# Patient Record
Sex: Female | Born: 1985 | Race: White | Hispanic: No | Marital: Married
Health system: Southern US, Community
[De-identification: ages and names within clinical notes are randomized; demographics above are authoritative.]

## PROBLEM LIST (undated history)

## (undated) DIAGNOSIS — C8190 Hodgkin lymphoma, unspecified, unspecified site: Secondary | ICD-10-CM

## (undated) DIAGNOSIS — N9489 Other specified conditions associated with female genital organs and menstrual cycle: Secondary | ICD-10-CM

## (undated) DIAGNOSIS — F329 Major depressive disorder, single episode, unspecified: Secondary | ICD-10-CM

## (undated) DIAGNOSIS — F419 Anxiety disorder, unspecified: Secondary | ICD-10-CM

## (undated) DIAGNOSIS — N840 Polyp of corpus uteri: Secondary | ICD-10-CM

## (undated) DIAGNOSIS — F32A Depression, unspecified: Secondary | ICD-10-CM

## (undated) DIAGNOSIS — E039 Hypothyroidism, unspecified: Secondary | ICD-10-CM

## (undated) HISTORY — DX: Hodgkin lymphoma, unspecified, unspecified site: C81.90

## (undated) HISTORY — DX: Anxiety disorder, unspecified: F41.9

## (undated) HISTORY — DX: Depression, unspecified: F32.A

## (undated) HISTORY — DX: Other specified conditions associated with female genital organs and menstrual cycle: N94.89

## (undated) HISTORY — DX: Hypothyroidism, unspecified: E03.9

## (undated) HISTORY — DX: Polyp of corpus uteri: N84.0

---
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
## 2006-08-07 ENCOUNTER — Emergency Department (HOSPITAL_COMMUNITY): Admission: EM | Admit: 2006-08-07 | Discharge: 2006-08-07 | Payer: Self-pay | Admitting: Emergency Medicine

## 2008-05-14 ENCOUNTER — Emergency Department (HOSPITAL_COMMUNITY): Admission: AC | Admit: 2008-05-14 | Discharge: 2008-05-14 | Payer: Self-pay

## 2008-05-19 ENCOUNTER — Ambulatory Visit: Payer: Self-pay | Admitting: Thoracic Surgery

## 2008-05-21 ENCOUNTER — Ambulatory Visit (HOSPITAL_COMMUNITY): Admission: RE | Admit: 2008-05-21 | Discharge: 2008-05-21 | Payer: Self-pay | Admitting: Thoracic Surgery

## 2008-05-27 ENCOUNTER — Ambulatory Visit (HOSPITAL_COMMUNITY): Admission: RE | Admit: 2008-05-27 | Discharge: 2008-05-27 | Payer: Self-pay | Admitting: Thoracic Surgery

## 2008-05-27 ENCOUNTER — Ambulatory Visit: Payer: Self-pay | Admitting: Thoracic Surgery

## 2008-05-27 ENCOUNTER — Encounter: Payer: Self-pay | Admitting: Thoracic Surgery

## 2008-06-01 ENCOUNTER — Encounter: Admission: RE | Admit: 2008-06-01 | Discharge: 2008-06-01 | Payer: Self-pay

## 2008-06-01 ENCOUNTER — Ambulatory Visit: Payer: Self-pay | Admitting: Thoracic Surgery

## 2008-06-14 ENCOUNTER — Encounter: Payer: Self-pay | Admitting: Thoracic Surgery

## 2008-06-14 ENCOUNTER — Ambulatory Visit (HOSPITAL_COMMUNITY): Admission: RE | Admit: 2008-06-14 | Discharge: 2008-06-14 | Payer: Self-pay | Admitting: Thoracic Surgery

## 2008-06-16 ENCOUNTER — Ambulatory Visit: Payer: Self-pay | Admitting: Internal Medicine

## 2008-06-16 ENCOUNTER — Ambulatory Visit: Payer: Self-pay | Admitting: Thoracic Surgery

## 2008-06-22 LAB — CBC WITH DIFFERENTIAL/PLATELET
Basophils Absolute: 0 10*3/uL (ref 0.0–0.1)
EOS%: 1.8 % (ref 0.0–7.0)
HCT: 35.1 % (ref 34.8–46.6)
HGB: 12.1 g/dL (ref 11.6–15.9)
LYMPH%: 21.8 % (ref 14.0–48.0)
MCH: 28.7 pg (ref 26.0–34.0)
MCHC: 34.6 g/dL (ref 32.0–36.0)
MCV: 82.9 fL (ref 81.0–101.0)
MONO%: 5.7 % (ref 0.0–13.0)
NEUT%: 70.4 % (ref 39.6–76.8)
Platelets: 249 10*3/uL (ref 145–400)
lymph#: 1.4 10*3/uL (ref 0.9–3.3)

## 2008-06-22 LAB — COMPREHENSIVE METABOLIC PANEL
AST: 10 U/L (ref 0–37)
BUN: 11 mg/dL (ref 6–23)
Calcium: 9 mg/dL (ref 8.4–10.5)
Chloride: 104 mEq/L (ref 96–112)
Creatinine, Ser: 0.55 mg/dL (ref 0.40–1.20)
Total Bilirubin: 0.5 mg/dL (ref 0.3–1.2)

## 2008-06-28 ENCOUNTER — Ambulatory Visit: Admission: RE | Admit: 2008-06-28 | Discharge: 2008-06-28 | Payer: Self-pay | Admitting: Internal Medicine

## 2008-06-30 ENCOUNTER — Ambulatory Visit: Payer: Self-pay | Admitting: Thoracic Surgery

## 2008-06-30 ENCOUNTER — Ambulatory Visit: Admission: RE | Admit: 2008-06-30 | Discharge: 2008-06-30 | Payer: Self-pay | Admitting: Internal Medicine

## 2008-06-30 ENCOUNTER — Encounter: Payer: Self-pay | Admitting: Internal Medicine

## 2008-06-30 ENCOUNTER — Ambulatory Visit: Payer: Self-pay | Admitting: Cardiovascular Disease

## 2008-07-02 ENCOUNTER — Ambulatory Visit: Payer: Self-pay | Admitting: Thoracic Surgery

## 2008-07-02 ENCOUNTER — Ambulatory Visit (HOSPITAL_COMMUNITY): Admission: RE | Admit: 2008-07-02 | Discharge: 2008-07-02 | Payer: Self-pay | Admitting: Thoracic Surgery

## 2008-07-05 ENCOUNTER — Encounter: Payer: Self-pay | Admitting: Internal Medicine

## 2008-07-05 ENCOUNTER — Ambulatory Visit: Payer: Self-pay | Admitting: Internal Medicine

## 2008-07-05 ENCOUNTER — Ambulatory Visit (HOSPITAL_COMMUNITY): Admission: RE | Admit: 2008-07-05 | Discharge: 2008-07-05 | Payer: Self-pay | Admitting: Internal Medicine

## 2008-07-13 LAB — CBC WITH DIFFERENTIAL/PLATELET
BASO%: 0.5 % (ref 0.0–2.0)
LYMPH%: 50.9 % — ABNORMAL HIGH (ref 14.0–48.0)
MCHC: 35 g/dL (ref 32.0–36.0)
MONO#: 0 10*3/uL — ABNORMAL LOW (ref 0.1–0.9)
MONO%: 1.4 % (ref 0.0–13.0)
NEUT#: 1.3 10*3/uL — ABNORMAL LOW (ref 1.5–6.5)
Platelets: 218 10*3/uL (ref 145–400)
RBC: 4.15 10*6/uL (ref 3.70–5.32)
RDW: 13 % (ref 11.3–14.5)
WBC: 3 10*3/uL — ABNORMAL LOW (ref 3.9–10.0)

## 2008-07-13 LAB — COMPREHENSIVE METABOLIC PANEL
ALT: 76 U/L — ABNORMAL HIGH (ref 0–35)
Albumin: 4.4 g/dL (ref 3.5–5.2)
Alkaline Phosphatase: 62 U/L (ref 39–117)
CO2: 25 mEq/L (ref 19–32)
Potassium: 4.3 mEq/L (ref 3.5–5.3)
Sodium: 142 mEq/L (ref 135–145)
Total Bilirubin: 0.5 mg/dL (ref 0.3–1.2)
Total Protein: 7.8 g/dL (ref 6.0–8.3)

## 2008-07-20 LAB — CBC WITH DIFFERENTIAL/PLATELET
BASO%: 0.8 % (ref 0.0–2.0)
Basophils Absolute: 0 10*3/uL (ref 0.0–0.1)
EOS%: 2.6 % (ref 0.0–7.0)
HCT: 34.4 % — ABNORMAL LOW (ref 34.8–46.6)
HGB: 12 g/dL (ref 11.6–15.9)
MCH: 28.5 pg (ref 26.0–34.0)
MCHC: 34.8 g/dL (ref 32.0–36.0)
MCV: 82 fL (ref 81.0–101.0)
MONO%: 20.9 % — ABNORMAL HIGH (ref 0.0–13.0)
NEUT%: 21 % — ABNORMAL LOW (ref 39.6–76.8)
lymph#: 0.9 10*3/uL (ref 0.9–3.3)

## 2008-07-26 LAB — LACTATE DEHYDROGENASE: LDH: 293 U/L — ABNORMAL HIGH (ref 94–250)

## 2008-07-26 LAB — CBC WITH DIFFERENTIAL/PLATELET
BASO%: 0.2 % (ref 0.0–2.0)
EOS%: 0.2 % (ref 0.0–7.0)
HCT: 33.2 % — ABNORMAL LOW (ref 34.8–46.6)
LYMPH%: 19 % (ref 14.0–48.0)
MCH: 28.1 pg (ref 26.0–34.0)
MCHC: 34 g/dL (ref 32.0–36.0)
MONO%: 1.2 % (ref 0.0–13.0)
NEUT%: 79.4 % — ABNORMAL HIGH (ref 39.6–76.8)
Platelets: 217 10*3/uL (ref 145–400)

## 2008-07-26 LAB — COMPREHENSIVE METABOLIC PANEL
ALT: 68 U/L — ABNORMAL HIGH (ref 0–35)
AST: 48 U/L — ABNORMAL HIGH (ref 0–37)
Alkaline Phosphatase: 98 U/L (ref 39–117)
Creatinine, Ser: 0.51 mg/dL (ref 0.40–1.20)
Total Bilirubin: 0.3 mg/dL (ref 0.3–1.2)

## 2008-07-29 LAB — WOUND CULTURE

## 2008-08-01 LAB — CULTURE, BLOOD (SINGLE)

## 2008-08-05 ENCOUNTER — Ambulatory Visit: Payer: Self-pay | Admitting: Internal Medicine

## 2008-08-09 LAB — CBC WITH DIFFERENTIAL/PLATELET
BASO%: 0.7 % (ref 0.0–2.0)
Basophils Absolute: 0.1 10*3/uL (ref 0.0–0.1)
EOS%: 0.9 % (ref 0.0–7.0)
HGB: 11.2 g/dL — ABNORMAL LOW (ref 11.6–15.9)
MCH: 27.2 pg (ref 25.1–34.0)
MONO%: 6.1 % (ref 0.0–14.0)
RBC: 4.12 10*6/uL (ref 3.70–5.45)
RDW: 14.4 % (ref 11.2–14.5)
lymph#: 2.4 10*3/uL (ref 0.9–3.3)
nRBC: 1 % — ABNORMAL HIGH (ref 0–0)

## 2008-08-09 LAB — COMPREHENSIVE METABOLIC PANEL
ALT: 38 U/L — ABNORMAL HIGH (ref 0–35)
AST: 24 U/L (ref 0–37)
Albumin: 4.1 g/dL (ref 3.5–5.2)
Alkaline Phosphatase: 71 U/L (ref 39–117)
Potassium: 3.8 mEq/L (ref 3.5–5.3)
Sodium: 136 mEq/L (ref 135–145)
Total Bilirubin: 0.2 mg/dL — ABNORMAL LOW (ref 0.3–1.2)
Total Protein: 6.5 g/dL (ref 6.0–8.3)

## 2008-08-23 LAB — COMPREHENSIVE METABOLIC PANEL
ALT: 35 U/L (ref 0–35)
Alkaline Phosphatase: 60 U/L (ref 39–117)
CO2: 25 mEq/L (ref 19–32)
Creatinine, Ser: 0.55 mg/dL (ref 0.40–1.20)
Glucose, Bld: 84 mg/dL (ref 70–99)
Sodium: 141 mEq/L (ref 135–145)
Total Bilirubin: 0.3 mg/dL (ref 0.3–1.2)
Total Protein: 6.4 g/dL (ref 6.0–8.3)

## 2008-08-23 LAB — LACTATE DEHYDROGENASE: LDH: 210 U/L (ref 94–250)

## 2008-08-23 LAB — CBC WITH DIFFERENTIAL/PLATELET
BASO%: 0.5 % (ref 0.0–2.0)
HCT: 31.3 % — ABNORMAL LOW (ref 34.8–46.6)
LYMPH%: 22.8 % (ref 14.0–49.7)
MCHC: 34.8 g/dL (ref 31.5–36.0)
MCV: 81.7 fL (ref 79.5–101.0)
MONO#: 0.6 10*3/uL (ref 0.1–0.9)
MONO%: 6.3 % (ref 0.0–14.0)
NEUT%: 70 % (ref 38.4–76.8)
Platelets: 275 10*3/uL (ref 145–400)
RBC: 3.83 10*6/uL (ref 3.70–5.45)

## 2008-09-01 ENCOUNTER — Ambulatory Visit (HOSPITAL_COMMUNITY): Admission: RE | Admit: 2008-09-01 | Discharge: 2008-09-01 | Payer: Self-pay | Admitting: Internal Medicine

## 2008-09-01 LAB — BASIC METABOLIC PANEL
BUN: 6 mg/dL (ref 6–23)
Chloride: 102 mEq/L (ref 96–112)
Potassium: 3.6 mEq/L (ref 3.5–5.3)
Sodium: 139 mEq/L (ref 135–145)

## 2008-09-01 LAB — CBC WITH DIFFERENTIAL/PLATELET
Basophils Absolute: 0 10*3/uL (ref 0.0–0.1)
EOS%: 0.7 % (ref 0.0–7.0)
HGB: 10.6 g/dL — ABNORMAL LOW (ref 11.6–15.9)
MCH: 28 pg (ref 25.1–34.0)
NEUT#: 4.4 10*3/uL (ref 1.5–6.5)
RDW: 15.9 % — ABNORMAL HIGH (ref 11.2–14.5)
lymph#: 1.9 10*3/uL (ref 0.9–3.3)

## 2008-09-06 LAB — CBC WITH DIFFERENTIAL/PLATELET
Basophils Absolute: 0.1 10*3/uL (ref 0.0–0.1)
Eosinophils Absolute: 0.1 10*3/uL (ref 0.0–0.5)
HCT: 33 % — ABNORMAL LOW (ref 34.8–46.6)
HGB: 11.1 g/dL — ABNORMAL LOW (ref 11.6–15.9)
LYMPH%: 22.2 % (ref 14.0–49.7)
MCV: 80.1 fL (ref 79.5–101.0)
MONO%: 9.7 % (ref 0.0–14.0)
NEUT#: 5.9 10*3/uL (ref 1.5–6.5)
NEUT%: 66.4 % (ref 38.4–76.8)
Platelets: 169 10*3/uL (ref 145–400)
RBC: 4.12 10*6/uL (ref 3.70–5.45)

## 2008-09-06 LAB — COMPREHENSIVE METABOLIC PANEL
BUN: 11 mg/dL (ref 6–23)
CO2: 25 mEq/L (ref 19–32)
Calcium: 9 mg/dL (ref 8.4–10.5)
Creatinine, Ser: 0.56 mg/dL (ref 0.40–1.20)
Glucose, Bld: 114 mg/dL — ABNORMAL HIGH (ref 70–99)
Total Bilirubin: 0.3 mg/dL (ref 0.3–1.2)

## 2008-09-06 LAB — LACTATE DEHYDROGENASE: LDH: 201 U/L (ref 94–250)

## 2008-09-09 ENCOUNTER — Ambulatory Visit (HOSPITAL_COMMUNITY): Admission: RE | Admit: 2008-09-09 | Discharge: 2008-09-09 | Payer: Self-pay | Admitting: Thoracic Surgery

## 2008-09-09 ENCOUNTER — Ambulatory Visit: Payer: Self-pay | Admitting: Thoracic Surgery

## 2008-09-14 ENCOUNTER — Ambulatory Visit: Payer: Self-pay | Admitting: Thoracic Surgery

## 2008-09-14 ENCOUNTER — Ambulatory Visit (HOSPITAL_COMMUNITY): Admission: RE | Admit: 2008-09-14 | Discharge: 2008-09-14 | Payer: Self-pay | Admitting: Thoracic Surgery

## 2008-09-20 ENCOUNTER — Ambulatory Visit: Payer: Self-pay | Admitting: Internal Medicine

## 2008-09-20 LAB — COMPREHENSIVE METABOLIC PANEL
Albumin: 3.9 g/dL (ref 3.5–5.2)
Alkaline Phosphatase: 63 U/L (ref 39–117)
CO2: 23 mEq/L (ref 19–32)
Calcium: 8.7 mg/dL (ref 8.4–10.5)
Chloride: 107 mEq/L (ref 96–112)
Glucose, Bld: 77 mg/dL (ref 70–99)
Potassium: 3.9 mEq/L (ref 3.5–5.3)
Sodium: 139 mEq/L (ref 135–145)
Total Protein: 6.1 g/dL (ref 6.0–8.3)

## 2008-09-20 LAB — CBC WITH DIFFERENTIAL/PLATELET
BASO%: 0.9 % (ref 0.0–2.0)
LYMPH%: 17 % (ref 14.0–49.7)
MCHC: 32.8 g/dL (ref 31.5–36.0)
MONO#: 1 10*3/uL — ABNORMAL HIGH (ref 0.1–0.9)
MONO%: 6.7 % (ref 0.0–14.0)
Platelets: 144 10*3/uL — ABNORMAL LOW (ref 145–400)
RBC: 3.58 10*6/uL — ABNORMAL LOW (ref 3.70–5.45)
RDW: 18.3 % — ABNORMAL HIGH (ref 11.2–14.5)
WBC: 15.3 10*3/uL — ABNORMAL HIGH (ref 3.9–10.3)

## 2008-10-04 LAB — COMPREHENSIVE METABOLIC PANEL
ALT: 36 U/L — ABNORMAL HIGH (ref 0–35)
AST: 24 U/L (ref 0–37)
Chloride: 105 mEq/L (ref 96–112)
Creatinine, Ser: 0.51 mg/dL (ref 0.40–1.20)
Sodium: 138 mEq/L (ref 135–145)
Total Bilirubin: 0.4 mg/dL (ref 0.3–1.2)

## 2008-10-04 LAB — LACTATE DEHYDROGENASE: LDH: 211 U/L (ref 94–250)

## 2008-10-04 LAB — CBC WITH DIFFERENTIAL/PLATELET
BASO%: 0.3 % (ref 0.0–2.0)
EOS%: 1.2 % (ref 0.0–7.0)
HCT: 29.4 % — ABNORMAL LOW (ref 34.8–46.6)
LYMPH%: 15.4 % (ref 14.0–49.7)
MCH: 27.9 pg (ref 25.1–34.0)
MCHC: 33 g/dL (ref 31.5–36.0)
MONO%: 8.6 % (ref 0.0–14.0)
NEUT%: 74.5 % (ref 38.4–76.8)
Platelets: 160 10*3/uL (ref 145–400)

## 2008-10-06 ENCOUNTER — Inpatient Hospital Stay (HOSPITAL_COMMUNITY): Admission: EM | Admit: 2008-10-06 | Discharge: 2008-10-12 | Payer: Self-pay | Admitting: Emergency Medicine

## 2008-10-07 ENCOUNTER — Other Ambulatory Visit: Payer: Self-pay | Admitting: Cardiothoracic Surgery

## 2008-10-15 ENCOUNTER — Ambulatory Visit: Payer: Self-pay | Admitting: Thoracic Surgery

## 2008-10-20 ENCOUNTER — Ambulatory Visit: Payer: Self-pay | Admitting: Thoracic Surgery

## 2008-10-21 ENCOUNTER — Ambulatory Visit: Payer: Self-pay | Admitting: Internal Medicine

## 2008-10-25 LAB — COMPREHENSIVE METABOLIC PANEL
Alkaline Phosphatase: 95 U/L (ref 39–117)
BUN: 8 mg/dL (ref 6–23)
CO2: 21 mEq/L (ref 19–32)
Glucose, Bld: 93 mg/dL (ref 70–99)
Total Bilirubin: 0.7 mg/dL (ref 0.3–1.2)

## 2008-10-25 LAB — CBC WITH DIFFERENTIAL/PLATELET
Basophils Absolute: 0.1 10*3/uL (ref 0.0–0.1)
Eosinophils Absolute: 0.1 10*3/uL (ref 0.0–0.5)
LYMPH%: 20.4 % (ref 14.0–49.7)
MCH: 28.2 pg (ref 25.1–34.0)
MCHC: 34.4 g/dL (ref 31.5–36.0)
MONO#: 0.7 10*3/uL (ref 0.1–0.9)
MONO%: 8.1 % (ref 0.0–14.0)
NEUT%: 69.2 % (ref 38.4–76.8)
RBC: 4.4 10*6/uL (ref 3.70–5.45)
WBC: 8 10*3/uL (ref 3.9–10.3)

## 2008-10-25 LAB — LACTATE DEHYDROGENASE: LDH: 216 U/L (ref 94–250)

## 2008-10-26 ENCOUNTER — Ambulatory Visit: Payer: Self-pay | Admitting: Thoracic Surgery

## 2008-10-28 ENCOUNTER — Ambulatory Visit: Payer: Self-pay | Admitting: Thoracic Surgery

## 2008-11-10 ENCOUNTER — Ambulatory Visit (HOSPITAL_COMMUNITY): Admission: RE | Admit: 2008-11-10 | Discharge: 2008-11-10 | Payer: Self-pay | Admitting: Internal Medicine

## 2008-11-15 LAB — COMPREHENSIVE METABOLIC PANEL
ALT: 34 U/L (ref 0–35)
CO2: 21 mEq/L (ref 19–32)
Calcium: 9.1 mg/dL (ref 8.4–10.5)
Chloride: 106 mEq/L (ref 96–112)
Glucose, Bld: 82 mg/dL (ref 70–99)
Sodium: 137 mEq/L (ref 135–145)
Total Protein: 6.5 g/dL (ref 6.0–8.3)

## 2008-11-15 LAB — CBC WITH DIFFERENTIAL/PLATELET
BASO%: 0.4 % (ref 0.0–2.0)
Eosinophils Absolute: 0.1 10*3/uL (ref 0.0–0.5)
HCT: 33.4 % — ABNORMAL LOW (ref 34.8–46.6)
MCHC: 33.2 g/dL (ref 31.5–36.0)
MONO#: 0.5 10*3/uL (ref 0.1–0.9)
NEUT#: 4.3 10*3/uL (ref 1.5–6.5)
NEUT%: 64.2 % (ref 38.4–76.8)
Platelets: 192 10*3/uL (ref 145–400)
RBC: 3.87 10*6/uL (ref 3.70–5.45)
WBC: 6.7 10*3/uL (ref 3.9–10.3)
lymph#: 1.8 10*3/uL (ref 0.9–3.3)

## 2008-11-15 LAB — LACTATE DEHYDROGENASE: LDH: 123 U/L (ref 94–250)

## 2008-11-17 ENCOUNTER — Ambulatory Visit: Admission: RE | Admit: 2008-11-17 | Discharge: 2009-01-02 | Payer: Self-pay | Admitting: Radiation Oncology

## 2009-01-31 ENCOUNTER — Encounter: Admission: RE | Admit: 2009-01-31 | Discharge: 2009-03-07 | Payer: Self-pay | Admitting: Orthopedic Surgery

## 2009-02-07 ENCOUNTER — Ambulatory Visit: Payer: Self-pay | Admitting: Internal Medicine

## 2009-02-09 ENCOUNTER — Ambulatory Visit (HOSPITAL_COMMUNITY): Admission: RE | Admit: 2009-02-09 | Discharge: 2009-02-09 | Payer: Self-pay | Admitting: Internal Medicine

## 2009-02-09 LAB — CBC WITH DIFFERENTIAL/PLATELET
Basophils Absolute: 0 10*3/uL (ref 0.0–0.1)
Eosinophils Absolute: 0.4 10*3/uL (ref 0.0–0.5)
HCT: 34.5 % — ABNORMAL LOW (ref 34.8–46.6)
HGB: 12.1 g/dL (ref 11.6–15.9)
LYMPH%: 28.3 % (ref 14.0–49.7)
MCV: 85 fL (ref 79.5–101.0)
MONO#: 0.3 10*3/uL (ref 0.1–0.9)
MONO%: 7.9 % (ref 0.0–14.0)
NEUT#: 1.7 10*3/uL (ref 1.5–6.5)
NEUT%: 51.6 % (ref 38.4–76.8)
Platelets: 191 10*3/uL (ref 145–400)
WBC: 3.3 10*3/uL — ABNORMAL LOW (ref 3.9–10.3)

## 2009-02-09 LAB — LACTATE DEHYDROGENASE: LDH: 109 U/L (ref 94–250)

## 2009-02-09 LAB — COMPREHENSIVE METABOLIC PANEL
Alkaline Phosphatase: 43 U/L (ref 39–117)
BUN: 12 mg/dL (ref 6–23)
CO2: 23 mEq/L (ref 19–32)
Creatinine, Ser: 0.57 mg/dL (ref 0.40–1.20)
Glucose, Bld: 86 mg/dL (ref 70–99)
Sodium: 136 mEq/L (ref 135–145)
Total Bilirubin: 0.4 mg/dL (ref 0.3–1.2)
Total Protein: 6.6 g/dL (ref 6.0–8.3)

## 2009-05-19 ENCOUNTER — Encounter: Admission: RE | Admit: 2009-05-19 | Discharge: 2009-06-08 | Payer: Self-pay | Admitting: Orthopedic Surgery

## 2009-06-13 ENCOUNTER — Ambulatory Visit: Payer: Self-pay | Admitting: Internal Medicine

## 2009-06-16 ENCOUNTER — Ambulatory Visit (HOSPITAL_COMMUNITY): Admission: RE | Admit: 2009-06-16 | Discharge: 2009-06-16 | Payer: Self-pay | Admitting: Internal Medicine

## 2009-06-16 LAB — CBC WITH DIFFERENTIAL/PLATELET
BASO%: 0.4 % (ref 0.0–2.0)
EOS%: 1.9 % (ref 0.0–7.0)
HCT: 37.6 % (ref 34.8–46.6)
LYMPH%: 30.5 % (ref 14.0–49.7)
MCH: 29.9 pg (ref 25.1–34.0)
MCHC: 34.5 g/dL (ref 31.5–36.0)
MONO#: 0.4 10*3/uL (ref 0.1–0.9)
NEUT%: 59.2 % (ref 38.4–76.8)
Platelets: 227 10*3/uL (ref 145–400)
RBC: 4.34 10*6/uL (ref 3.70–5.45)
WBC: 5.3 10*3/uL (ref 3.9–10.3)
lymph#: 1.6 10*3/uL (ref 0.9–3.3)

## 2009-06-16 LAB — COMPREHENSIVE METABOLIC PANEL
ALT: 15 U/L (ref 0–35)
AST: 19 U/L (ref 0–37)
Creatinine, Ser: 0.55 mg/dL (ref 0.40–1.20)
Sodium: 140 mEq/L (ref 135–145)
Total Bilirubin: 0.7 mg/dL (ref 0.3–1.2)
Total Protein: 7.5 g/dL (ref 6.0–8.3)

## 2009-07-11 ENCOUNTER — Encounter: Admission: RE | Admit: 2009-07-11 | Discharge: 2009-08-18 | Payer: Self-pay | Admitting: Orthopedic Surgery

## 2009-09-05 ENCOUNTER — Encounter
Admission: RE | Admit: 2009-09-05 | Discharge: 2009-10-13 | Payer: Self-pay | Admitting: Physical Medicine and Rehabilitation

## 2009-12-13 ENCOUNTER — Ambulatory Visit: Payer: Self-pay | Admitting: Internal Medicine

## 2009-12-15 ENCOUNTER — Ambulatory Visit (HOSPITAL_COMMUNITY): Admission: RE | Admit: 2009-12-15 | Discharge: 2009-12-15 | Payer: Self-pay | Admitting: Internal Medicine

## 2009-12-15 LAB — CBC WITH DIFFERENTIAL/PLATELET
Basophils Absolute: 0 10*3/uL (ref 0.0–0.1)
EOS%: 2 % (ref 0.0–7.0)
HCT: 35.2 % (ref 34.8–46.6)
HGB: 12.4 g/dL (ref 11.6–15.9)
LYMPH%: 34.3 % (ref 14.0–49.7)
MCH: 30.6 pg (ref 25.1–34.0)
MCHC: 35.3 g/dL (ref 31.5–36.0)
MCV: 86.8 fL (ref 79.5–101.0)
MONO%: 7.4 % (ref 0.0–14.0)
NEUT%: 55.7 % (ref 38.4–76.8)
Platelets: 197 10*3/uL (ref 145–400)

## 2009-12-15 LAB — COMPREHENSIVE METABOLIC PANEL
AST: 22 U/L (ref 0–37)
BUN: 10 mg/dL (ref 6–23)
Calcium: 9.2 mg/dL (ref 8.4–10.5)
Chloride: 105 mEq/L (ref 96–112)
Creatinine, Ser: 0.58 mg/dL (ref 0.40–1.20)
Total Bilirubin: 0.8 mg/dL (ref 0.3–1.2)

## 2010-04-05 ENCOUNTER — Emergency Department (HOSPITAL_COMMUNITY): Admission: EM | Admit: 2010-04-05 | Discharge: 2010-04-05 | Payer: Self-pay | Admitting: Emergency Medicine

## 2010-05-11 ENCOUNTER — Ambulatory Visit: Payer: Self-pay | Admitting: Internal Medicine

## 2010-05-16 ENCOUNTER — Ambulatory Visit (HOSPITAL_COMMUNITY)
Admission: RE | Admit: 2010-05-16 | Discharge: 2010-05-16 | Payer: Self-pay | Source: Home / Self Care | Attending: Internal Medicine | Admitting: Internal Medicine

## 2010-05-16 LAB — COMPREHENSIVE METABOLIC PANEL
Albumin: 3.9 g/dL (ref 3.5–5.2)
BUN: 11 mg/dL (ref 6–23)
Calcium: 9.3 mg/dL (ref 8.4–10.5)
Chloride: 104 mEq/L (ref 96–112)
Glucose, Bld: 89 mg/dL (ref 70–99)
Potassium: 3.8 mEq/L (ref 3.5–5.3)

## 2010-05-16 LAB — CBC WITH DIFFERENTIAL/PLATELET
Basophils Absolute: 0 10*3/uL (ref 0.0–0.1)
Eosinophils Absolute: 0.1 10*3/uL (ref 0.0–0.5)
HCT: 37.7 % (ref 34.8–46.6)
HGB: 13.2 g/dL (ref 11.6–15.9)
MCV: 86.9 fL (ref 79.5–101.0)
MONO%: 7.6 % (ref 0.0–14.0)
NEUT#: 3 10*3/uL (ref 1.5–6.5)
RDW: 12.9 % (ref 11.2–14.5)
lymph#: 1.4 10*3/uL (ref 0.9–3.3)

## 2010-07-01 ENCOUNTER — Other Ambulatory Visit: Payer: Self-pay | Admitting: Internal Medicine

## 2010-07-01 DIAGNOSIS — C8590 Non-Hodgkin lymphoma, unspecified, unspecified site: Secondary | ICD-10-CM

## 2010-07-02 ENCOUNTER — Encounter: Payer: Self-pay | Admitting: Thoracic Surgery

## 2010-07-02 ENCOUNTER — Encounter: Payer: Self-pay | Admitting: Internal Medicine

## 2010-08-03 ENCOUNTER — Ambulatory Visit: Payer: 59 | Attending: Radiation Oncology | Admitting: Radiation Oncology

## 2010-08-23 LAB — BASIC METABOLIC PANEL
Calcium: 9.2 mg/dL (ref 8.4–10.5)
GFR calc Af Amer: >60 mL/min (ref >60)
GFR calc non Af Amer: >60 mL/min (ref >60)
Sodium: 139 mEq/L (ref 135–145)

## 2010-08-23 LAB — CBC
Hemoglobin: 13 g/dL (ref 12.0–15.0)
RBC: 4.31 MIL/uL (ref 3.87–5.11)
WBC: 4.2 10*3/uL (ref 4.0–10.5)

## 2010-08-23 LAB — DIFFERENTIAL
Basophils Relative: 0 % (ref 0–1)
Lymphs Abs: 1.2 10*3/uL (ref 0.7–4.0)
Monocytes Relative: 6 % (ref 3–12)
Neutro Abs: 2.7 10*3/uL (ref 1.7–7.7)
Neutrophils Relative %: 63 % (ref 43–77)

## 2010-08-23 LAB — POCT CARDIAC MARKERS
CKMB, poc: <1 ng/mL — ABNORMAL LOW (ref 1.0–8.0)
Myoglobin, poc: 34.5 ng/mL (ref 12–200)

## 2010-09-18 LAB — GLUCOSE, CAPILLARY: Glucose-Capillary: 90 mg/dL (ref 70–99)

## 2010-09-19 LAB — CBC
HCT: 31 % — ABNORMAL LOW (ref 36.0–46.0)
Hemoglobin: 10.7 g/dL — ABNORMAL LOW (ref 12.0–15.0)
Hemoglobin: 9.8 g/dL — ABNORMAL LOW (ref 12.0–15.0)
MCHC: 34.1 g/dL (ref 30.0–36.0)
MCHC: 34.2 g/dL (ref 30.0–36.0)
MCHC: 34.5 g/dL (ref 30.0–36.0)
Platelets: 127 10*3/uL — ABNORMAL LOW (ref 150–400)
Platelets: 207 10*3/uL (ref 150–400)
RBC: 3.34 MIL/uL — ABNORMAL LOW (ref 3.87–5.11)
RBC: 3.65 MIL/uL — ABNORMAL LOW (ref 3.87–5.11)
RBC: 4.03 MIL/uL (ref 3.87–5.11)
RDW: 17.9 % — ABNORMAL HIGH (ref 11.5–15.5)
RDW: 17.9 % — ABNORMAL HIGH (ref 11.5–15.5)
RDW: 18 % — ABNORMAL HIGH (ref 11.5–15.5)
WBC: 2.8 10*3/uL — ABNORMAL LOW (ref 4.0–10.5)

## 2010-09-19 LAB — BASIC METABOLIC PANEL
BUN: 4 mg/dL — ABNORMAL LOW (ref 6–23)
CO2: 25 mEq/L (ref 19–32)
CO2: 25 mEq/L (ref 19–32)
CO2: 27 mEq/L (ref 19–32)
CO2: 28 mEq/L (ref 19–32)
Calcium: 8.5 mg/dL (ref 8.4–10.5)
Calcium: 8.8 mg/dL (ref 8.4–10.5)
Calcium: 8.8 mg/dL (ref 8.4–10.5)
Chloride: 102 mEq/L (ref 96–112)
Creatinine, Ser: 0.62 mg/dL (ref 0.4–1.2)
GFR calc Af Amer: >60 mL/min (ref >60)
GFR calc Af Amer: >60 mL/min (ref >60)
GFR calc non Af Amer: >60 mL/min (ref >60)
GFR calc non Af Amer: >60 mL/min (ref >60)
Glucose, Bld: 86 mg/dL (ref 70–99)
Glucose, Bld: 97 mg/dL (ref 70–99)
Potassium: 4.3 mEq/L (ref 3.5–5.1)
Sodium: 134 mEq/L — ABNORMAL LOW (ref 135–145)
Sodium: 136 mEq/L (ref 135–145)

## 2010-09-20 LAB — CROSSMATCH
ABO/RH(D): A NEG
Antibody Screen: NEGATIVE

## 2010-09-20 LAB — CBC
HCT: 22.4 % — ABNORMAL LOW (ref 36.0–46.0)
HCT: 24.4 % — ABNORMAL LOW (ref 36.0–46.0)
Hemoglobin: 8.4 g/dL — ABNORMAL LOW (ref 12.0–15.0)
Hemoglobin: 8.6 g/dL — ABNORMAL LOW (ref 12.0–15.0)
Hemoglobin: 9.4 g/dL — ABNORMAL LOW (ref 12.0–15.0)
MCHC: 34.2 g/dL (ref 30.0–36.0)
MCHC: 34.6 g/dL (ref 30.0–36.0)
MCHC: 35.3 g/dL (ref 30.0–36.0)
MCV: 84.8 fL (ref 78.0–100.0)
MCV: 84.9 fL (ref 78.0–100.0)
MCV: 80.3 fL (ref 78.0–100.0)
Platelets: 82 10*3/uL — ABNORMAL LOW (ref 150–400)
Platelets: 99 10*3/uL — ABNORMAL LOW (ref 150–400)
RBC: 2.88 MIL/uL — ABNORMAL LOW (ref 3.87–5.11)
RBC: 2.96 MIL/uL — ABNORMAL LOW (ref 3.87–5.11)
RBC: 3.33 MIL/uL — ABNORMAL LOW (ref 3.87–5.11)
RDW: 20.1 % — ABNORMAL HIGH (ref 11.5–15.5)
RDW: 21.3 % — ABNORMAL HIGH (ref 11.5–15.5)
RDW: 21.9 % — ABNORMAL HIGH (ref 11.5–15.5)
WBC: 13.1 10*3/uL — ABNORMAL HIGH (ref 4.0–10.5)
WBC: 3 10*3/uL — ABNORMAL LOW (ref 4.0–10.5)

## 2010-09-20 LAB — CULTURE, BLOOD (ROUTINE X 2)

## 2010-09-20 LAB — POCT I-STAT, CHEM 8
Calcium, Ion: 1.09 mmol/L — ABNORMAL LOW (ref 1.12–1.32)
Chloride: 102 mEq/L (ref 96–112)
Glucose, Bld: 109 mg/dL — ABNORMAL HIGH (ref 70–99)
HCT: 26 % — ABNORMAL LOW (ref 36.0–46.0)
Hemoglobin: 8.8 g/dL — ABNORMAL LOW (ref 12.0–15.0)
TCO2: 22 mmol/L (ref 0–100)

## 2010-09-20 LAB — DIFFERENTIAL
Basophils Relative: 1 % (ref 0–1)
Eosinophils Relative: 0 % (ref 0–5)
Lymphs Abs: 0.6 10*3/uL — ABNORMAL LOW (ref 0.7–4.0)
Monocytes Absolute: 0 10*3/uL — ABNORMAL LOW (ref 0.1–1.0)
Monocytes Relative: 0 % — ABNORMAL LOW (ref 3–12)
Neutro Abs: 31 10*3/uL — ABNORMAL HIGH (ref 1.7–7.7)
WBC Morphology: INCREASED

## 2010-09-20 LAB — COMPREHENSIVE METABOLIC PANEL
ALT: 29 U/L (ref 0–35)
ALT: 100 U/L — ABNORMAL HIGH (ref 0–35)
AST: 49 U/L — ABNORMAL HIGH (ref 0–37)
Alkaline Phosphatase: 70 U/L (ref 39–117)
CO2: 24 mEq/L (ref 19–32)
CO2: 25 mEq/L (ref 19–32)
Calcium: 7.9 mg/dL — ABNORMAL LOW (ref 8.4–10.5)
Chloride: 106 mEq/L (ref 96–112)
Creatinine, Ser: 0.5 mg/dL (ref 0.4–1.2)
GFR calc Af Amer: >60 mL/min (ref >60)
GFR calc Af Amer: >60 mL/min (ref >60)
GFR calc non Af Amer: >60 mL/min (ref >60)
GFR calc non Af Amer: >60 mL/min (ref >60)
Glucose, Bld: 91 mg/dL (ref 70–99)
Potassium: 3.3 mEq/L — ABNORMAL LOW (ref 3.5–5.1)
Potassium: 4.1 mEq/L (ref 3.5–5.1)
Sodium: 139 mEq/L (ref 135–145)
Total Bilirubin: 1 mg/dL (ref 0.3–1.2)
Total Bilirubin: 0.4 mg/dL (ref 0.3–1.2)

## 2010-09-20 LAB — BASIC METABOLIC PANEL
BUN: 5 mg/dL — ABNORMAL LOW (ref 6–23)
CO2: 26 mEq/L (ref 19–32)
Calcium: 8.4 mg/dL (ref 8.4–10.5)
Chloride: 106 mEq/L (ref 96–112)
Creatinine, Ser: 0.63 mg/dL (ref 0.4–1.2)
GFR calc Af Amer: >60 mL/min (ref >60)
GFR calc non Af Amer: >60 mL/min (ref >60)
Glucose, Bld: 103 mg/dL — ABNORMAL HIGH (ref 70–99)
Potassium: 4.2 mEq/L (ref 3.5–5.1)
Sodium: 137 mEq/L (ref 135–145)

## 2010-09-20 LAB — MAGNESIUM
Magnesium: 1.8 mg/dL (ref 1.5–2.5)
Magnesium: 1.9 mg/dL (ref 1.5–2.5)
Magnesium: 1.9 mg/dL (ref 1.5–2.5)

## 2010-09-20 LAB — WOUND CULTURE: Gram Stain: NONE SEEN

## 2010-09-20 LAB — APTT: aPTT: 31 seconds (ref 24–37)

## 2010-09-20 LAB — URINALYSIS, ROUTINE W REFLEX MICROSCOPIC
Glucose, UA: NEGATIVE mg/dL
Protein, ur: NEGATIVE mg/dL
pH: 6 (ref 5.0–8.0)

## 2010-09-20 LAB — ANAEROBIC CULTURE: Gram Stain: NONE SEEN

## 2010-09-20 LAB — POTASSIUM: Potassium: 3.4 mEq/L — ABNORMAL LOW (ref 3.5–5.1)

## 2010-09-20 LAB — MRSA CULTURE

## 2010-09-20 LAB — PROTIME-INR: Prothrombin Time: 15 seconds (ref 11.6–15.2)

## 2010-09-21 LAB — GLUCOSE, CAPILLARY: Glucose-Capillary: 92 mg/dL (ref 70–99)

## 2010-09-25 LAB — COMPREHENSIVE METABOLIC PANEL
AST: 23 U/L (ref 0–37)
Albumin: 3.9 g/dL (ref 3.5–5.2)
Alkaline Phosphatase: 63 U/L (ref 39–117)
Chloride: 105 mEq/L (ref 96–112)
Creatinine, Ser: 0.52 mg/dL (ref 0.4–1.2)
GFR calc Af Amer: >60 mL/min (ref >60)
Potassium: 4 mEq/L (ref 3.5–5.1)
Total Bilirubin: 0.8 mg/dL (ref 0.3–1.2)
Total Protein: 6.8 g/dL (ref 6.0–8.3)

## 2010-09-25 LAB — CBC
MCV: 84.5 fL (ref 78.0–100.0)
Platelets: 205 10*3/uL (ref 150–400)
Platelets: 270 10*3/uL (ref 150–400)
RBC: 3.94 MIL/uL (ref 3.87–5.11)
RDW: 13.4 % (ref 11.5–15.5)
WBC: 5.1 10*3/uL (ref 4.0–10.5)
WBC: 5.9 10*3/uL (ref 4.0–10.5)

## 2010-09-25 LAB — DIFFERENTIAL
Eosinophils Absolute: 0.1 10*3/uL (ref 0.0–0.7)
Lymphocytes Relative: 30 % (ref 12–46)
Lymphs Abs: 1.5 10*3/uL (ref 0.7–4.0)
Monocytes Relative: 7 % (ref 3–12)
Neutrophils Relative %: 61 % (ref 43–77)

## 2010-09-25 LAB — PROTIME-INR: INR: 1 (ref 0.00–1.49)

## 2010-09-25 LAB — APTT: aPTT: 33 seconds (ref 24–37)

## 2010-09-25 LAB — CHROMOSOME ANALYSIS, BONE MARROW

## 2010-10-24 NOTE — Letter (Signed)
June 01, 2008   Lajuana Matte, MD  [address] [place], [place] [address]   Re:  Dominique Rhodes, Dominique Rhodes                DOB:  [birth year]   Dear [name],   I saw the patient back today and she gives a history of her sister  having Hodgkin disease. Her incision is well healed.  Her blood pressure  is 126/77, pulse 80, respirations 18, and sats are 99%.   We did a biopsy of the mediastinal mass and unfortunately, I talked to  Dr. Laureen Ochs and he will not call this as lymphoma.  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  I  have asked them to send this off to [place] for a second opinion and I  have her scheduled for a right mediastinotomy on June 08, 2008.  A  needle biopsy would not be feasible because of the intense marked amount  of fibrosis.  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   Ines Bloomer, M.D.  Electronically Signed   DPB/MEDQ  D:  06/01/2008  T:  06/01/2008  Job:  [id number]
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
## 2010-10-24 NOTE — Op Note (Signed)
NAMELENNIS, RADER              ACCOUNT NO.:  [id number]   MEDICAL RECORD NO.:  [id number]          PATIENT TYPE:  AMB   LOCATION:  SDS                          FACILITY:  [facility]   PHYSICIAN:  Ines Bloomer, M.D. DATE OF BIRTH:  31-Dec-1985   DATE OF PROCEDURE:  DATE OF DISCHARGE:  06/14/2008                               OPERATIVE REPORT   PREOPERATIVE DIAGNOSIS:  Anterior mediastinal mass.   POSTOPERATIVE DIAGNOSIS:  Anterior mediastinal mass.   OPERATION PERFORMED:  Right mediastinotomy.   SURGEON:  Dr. Ines Bloomer, M.D.   FIRST ASSISTANT:  Mr. Virgina Evener, [place].   ANESTHESIA:  General anesthesia.   After adequate general anesthesia, a 3-cm incision was made at the third  intercostal space and section was carried down to the subcutaneous  tissue.  The pectoral muscle was split and the mammary artery was  identified and reflected laterally.  We then immediately saw the  mediastinal mass and did multiple biopsies.  Frozen section showed lot  of fibrosis but with atypical cells, so we took several more areas of  tissue and then placed Surgicel on the wound.  Wounds were closed with 3-  0 Vicryl and muscle artery and 3-0 Vicryl for the subcuticular tissue,  Dermabond for the skin.  The patient returned to recovery room in stable  condition.      Ines Bloomer, M.D.  Electronically Signed     DPB/MEDQ  D:  06/14/2008  T:  06/15/2008  Job:  [id number]

## 2010-10-24 NOTE — Op Note (Signed)
NAMECORBY, VILLASENOR NO.:  [id number]   MEDICAL RECORD NO.:  [id number]          PATIENT TYPE:  INP   LOCATION:                               FACILITY:  [facility]   PHYSICIAN:  Kerin Perna, M.D.  DATE OF BIRTH:  01-Nov-1985   DATE OF PROCEDURE:  10/07/2008  DATE OF DISCHARGE:  10/12/2008                               OPERATIVE REPORT   OPERATIONS:  1. Removal of infected right Port-A-Cath.  2. Debridement of left old Port-A-Cath site.   PREOPERATIVE DIAGNOSIS:  Infected right Port-A-Cath, positive blood  cultures.   POSTOPERATIVE DIAGNOSIS:  Infected right Port-A-Cath, positive blood  cultures.   ANESTHESIA:  General.   SURGEON:  Kerin Perna, MD   PROCEDURE:  The patient is a 25 year old Caucasian female with recent  diagnosis of lymphoma receiving chemotherapy.  An infected left Port-A-  Cath placed by Dr. Edwyna Shell was removed in early April and a right Port-A-  Cath system was inserted via the right IJ on April 4th.  She returned to  the hospital with fevers and drainage from the right Port-A-Cath site  and blood cultures returned positive within 24 hours of admission.  She  has been on IV vancomycin.  I discussed removal of the right Port-A-Cath  system and debridement of the left with the patient and her family and  she agreed to proceed under informed consent.   The chest and neck were prepped and draped as a sterile field.  General  anesthesia was induced.  The pockets for the right reservoir was opened  and cultured.  The small incision in the right neck was also opened and  a Port-A-Cath was visualized.  The Port-A-Cath was removed and pressure  was applied to the right neck until bleeding stopped.  The main pocket  for the right reservoir was debrided and irrigated with antibiotic  irrigation.  The small area of the right neck was also debrided.  Both  the sites were packed with Iodoform gauze and closed loosely with  interrupted 3-0 nylon on  the skin to compartmentalized the wounds.   Next, the left Port-A-Cath site was opened and an old suture was  removed.  There was still a significant space under the skin, which had  not granulated and this was opened, debrided, and irrigated with  antibiotic irrigation and packed  with Iodoform.  One single nylon suture was used to loosely approximate  the skin and compartmentalized this wound as well.  Sterile dressings  then applied and the patient was reversed from general anesthesia and  returned to recovery room in stable condition with minimal blood loss.      Kerin Perna, M.D.  Electronically Signed     PV/MEDQ  D:  10/07/2008  T:  10/07/2008  Job:  [id number]   cc:   Lajuana Matte, MD

## 2010-10-24 NOTE — Op Note (Signed)
NAMEOLIVER, Rhodes              ACCOUNT NO.:  [id number]   MEDICAL RECORD NO.:  [id number]          PATIENT TYPE:  AMB   LOCATION:                               FACILITY:  [facility]   PHYSICIAN:  Ines Bloomer, M.D. DATE OF BIRTH:  [birth year]   DATE OF PROCEDURE:  DATE OF DISCHARGE:                               OPERATIVE REPORT   PREOPERATIVE DIAGNOSES:  Mediastinal mass.   POSTOPERATIVE DIAGNOSIS:  Mediastinal mass.   OPERATION PERFORMED:  Fiberoptic bronchoscopy and mediastinotomy.   SURGEON:  Ines Bloomer, MD   FIRST ASSISTANT:  Rowe Clack, PA-C   ANESTHESIA:  General anesthesia.   Video bronchoscope was passed through the endotracheal tube.  The carina  was in the midline.  Right upper lobe, right middle lobe, and right  lower lobe orifices were normal.  Left upper lobe and left lower lobe  orifices were normal.  Washings were taken from this area.  The video  bronchoscope was removed.  The chest was prepped and draped in usual  sterile manner.  A 2 to 3-cm incision was made over the third  intercostal space just left to the sternum and dissected down with  electrocautery through the subcutaneous tissue down to the pretracheal  fascia down to the pectoral muscle, which was split.  The mammary artery  and vein were identified, clipped, and divided.  We then found the mass  in the mediastinum, which was very fibrotic.  Multiple biopsies were  taken of frozen section revealed a lot of fibrosis with lymphoid  infiltrate.  We discussed with the pathologist that we had enough for  possible lymphoma workup.  The muscles were closed with 2-0 Vicryl,  subcutaneous with 3-0 Vicryl, and Dermabond for the skin.  The patient  was returned to the recovery room in stable condition.      Ines Bloomer, M.D.  Electronically Signed     DPB/MEDQ  D:  05/27/2008  T:  05/27/2008  Job:  [id number]

## 2010-10-24 NOTE — Letter (Signed)
September 09, 2008   Lajuana Matte, MD  [phone]  [place], [place] [address]   Re:  Dominique Rhodes, Dominique Rhodes                DOB:  [birth year]   Dear [name]:   I saw the patient back today and examined her Port-A-Cath and  apparently, she looks like she is having erosion of the Port-A-Cath  through the skin.  Because of this, I think it is a high probability  this will get infected, so I will remove the Port-A-Cath from the left  side and insert it in the right side.  We will do this on April 6.   Sincerely,   Ines Bloomer, M.D.  Electronically Signed   DPB/MEDQ  D:  09/09/2008  T:  09/10/2008  Job:  [id number]

## 2010-10-24 NOTE — Letter (Signed)
June 01, 2008   Lajuana Matte, MD  [address] [place], [place] [address]   Re:  Dominique Rhodes, Dominique Rhodes                DOB:  [birth year]   Dear [name],   I saw the patient back today and she gives a history of her sister  having Hodgkin disease. Her incision is well healed.  Her blood pressure  is 126/77, pulse 80, respirations 18, and sats are 99%.   We did a biopsy of the mediastinal mass and unfortunately, I talked to  Dr. Laureen Ochs and he will not call this as lymphoma.  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  I  have asked them to send this off to [place] for a second opinion and I  have her scheduled for a right mediastinotomy on June 08, 2008.  A  needle biopsy would not be feasible because of the intense marked amount  of fibrosis.  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   Dominique Rhodes, M.D.    DPB/MEDQ  D:  06/01/2008  T:  06/01/2008  Job:  [id number]

## 2010-10-24 NOTE — Assessment & Plan Note (Signed)
OFFICE VISIT   Dominique Rhodes, Dominique Rhodes  DOB:  1985-09-29                                        Oct 26, 2008  CHART #:  [id number]   The patient returned today.  She is afebrile, blood pressure is 123/77,  pulse 100, respirations 18, sats were 98%.  Removed the last chest tube  suture, the site suture, and one deep stitch.  The right area is open  but granulating well.  Told her to keep this clean and wash with soap  and water and will see her back again in 2 weeks for followup.   Ines Bloomer, M.D.  Electronically Signed   DPB/MEDQ  D:  10/26/2008  T:  10/27/2008  Job:  [id number]

## 2010-10-24 NOTE — Assessment & Plan Note (Signed)
OFFICE VISIT   Dominique Rhodes, Dominique Rhodes  DOB:  August 27, 1985                                        Oct 15, 2008  CHART #:  [id number]   She returns today after having two incisions opened and her right Port-A-  Cath removed.  She cultured out Staph.  The wounds are healing well.  We  removed one suture and removed the packing.  I do not think she needs to  have it packed anymore, we will just clean it with peroxide and see her  back again in 1 week.  She should be fine.  She is feeling much better.   Ines Bloomer, M.D.  Electronically Signed   DPB/MEDQ  D:  10/15/2008  T:  10/15/2008  Job:  [id number]
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
## 2010-10-24 NOTE — Op Note (Signed)
NAMEPARIS, Dominique Rhodes              ACCOUNT NO.:  [id number]   MEDICAL RECORD NO.:  [id number]          PATIENT TYPE:  AMB   LOCATION:  SDS                          FACILITY:  [facility]   PHYSICIAN:  Ines Bloomer, M.D. DATE OF BIRTH:  02-19-1986   DATE OF PROCEDURE:  07/02/2008  DATE OF DISCHARGE:  07/02/2008                               OPERATIVE REPORT   PREOPERATIVE DIAGNOSIS:  Hodgkin lymphoma.   POSTOPERATIVE DIAGNOSIS:  Hodgkin lymphoma.   OPERATION PERFORMED:  Insertion of left subclavian Port-A-Cath.   After prepping and draping the left chest with IV sedation, an area was  infiltrated with 1%Xylocaine of the left subclavian area and a left  subclavian puncture was performed and a guidewire threaded under fluoro  guidance to the right atrium.  A stab wound was made around the  guidewire and then an area where the previous median anatomy incision  was made on the left side was infiltrated with 1% Xylocaine and the scar  was excised and the port was dissected out.  A Bard 9.6 pre-attached  Port-A-Cath was inserted into the pocket and sutured in place with 2-0  silk and then the tubing tunneled from the pocket to the stab wound  around the guidewire.  It was measured appropriately with fluoro to go  to the right atrial-SVC junction.  With the guidewire, I passed a  dilator with peel-away sheath.  Guidewire and dilator were removed and  the tubing was passed through the peel-away sheath.  The peel-away  sheath was removed.  The distal end of the Port-A-Cath tubing was in  distal SVC and this was confirmed with fluoro.  It flushed easily and  withdrew easily.  Wounds were closed with 3-0 Vicryl and Dermabond for  the skin.  The patient was turned to the recovery room in stable  condition.      Ines Bloomer, M.D.  Electronically Signed     DPB/MEDQ  D:  07/30/2008  T:  07/31/2008  Job:  [id number]

## 2010-10-24 NOTE — Assessment & Plan Note (Signed)
OFFICE VISIT   DAQUISHA, CLERMONT  DOB:  06/06/1986                                        June 30, 2008  CHART #:  [id number]   The patient returns today.  Her blood pressure is 123/72, pulse 75,  respirations 18, and sats were 99%.  Lungs were clear to auscultation  and percussion.  She is doing well overall.  Incisions are well healed.  I will plan to put a Port-a-Cath in her on July 02, 2008, at [facility].  She will start chemotherapy on July 06, 2008.   Ines Bloomer, M.D.  Electronically Signed   DPB/MEDQ  D:  06/30/2008  T:  07/01/2008  Job:  [id number]

## 2010-10-24 NOTE — Letter (Signed)
June 16, 2008   Lajuana Matte, MD  [phone]  [place], [place] [address]   Re:  Dominique Rhodes, Dominique Rhodes                DOB:  28-Dec-1985   Dear Dr. [name]:   The patient came to the office today and unfortunately after her second  mediastinotomy, the lesion was sent off to [place]  and they thought it  was possibly Hodgkin's disease but would not confirm it,  so we repeated  the mediastinotomy yesterday, and the repeat biopsy did confirm that  this is Hodgkin's lymphoma .  We will present her to cancer conference  and then I refer to you for therapy.  I appreciate the opportunity of  seeing the patient.  I will see her back again in 3 weeks for followup.   Ines Bloomer, M.D.  Electronically Signed   DPB/MEDQ  D:  06/16/2008  T:  06/17/2008  Job:  [id number]

## 2010-10-24 NOTE — Assessment & Plan Note (Signed)
OFFICE VISIT   Dominique Rhodes, Dominique Rhodes  DOB:  [birth year]                                        Oct 20, 2008  CHART #:  [id number]   The patient returned today and we removed all of her wound sutures from  her Port-a-Cath sites.  There is no evidence of recurrent infection.  She stopped her antibiotics.  We will see her again in 1 week, remove  the final suture and check her incisions again.  Her blood pressure was  124/72, pulse 100, respirations 18, and saturations were 97%.   Dominique Rhodes, M.D.  Electronically Signed   DPB/MEDQ  D:  10/20/2008  T:  10/21/2008  Job:  [id number]

## 2010-10-24 NOTE — Op Note (Signed)
Dominique Rhodes, Dominique Rhodes              ACCOUNT NO.:  [id number]   MEDICAL RECORD NO.:  [id number]          PATIENT TYPE:  AMB   LOCATION:  SDS                          FACILITY:  [facility]   PHYSICIAN:  Ines Bloomer, M.D. DATE OF BIRTH:  January 29, 1986   DATE OF PROCEDURE:  DATE OF DISCHARGE:  09/14/2008                               OPERATIVE REPORT   PREOPERATIVE DIAGNOSIS:  Exposed left subclavian Port-A-cath.   POSTOPERATIVE DIAGNOSIS:  Exposed left subclavian Port-A-cath.   OPERATION PERFORMED:  Insertion of right internal jugular Port-A-cath  and removal of the left subclavian Port-A-cath.  This patient had a Port-  A-cath placed on the left subclavian position over a period of time and  after multiple sticks that eroded through the skin.  For this reason, it  was elected to remove that Port-A-cath and put in a new Port-A-cath.   DESCRIPTION OF PROCEDURE:  The patient's chest was prepped and draped in  usual sterile manner.  The right IJ puncture was performed and a  guidewire threaded under fluoro to the right atrium.  Stab wounds were  made around the guidewire.  Another Port-A-cath was placed  intraclavicularly just above the subclavian port and dissection was  carried down to the subcutaneous tissue.  A 9.6 preattached PowerPort  was inserted and channeled from the Port-A-cath up to the stab wound  around the guidewire.  It was then measured to go into the IVC under  fluoro and cut and then the Port-A-cath was sutured and placed with 3-0  silk.  Over the guidewire was passed a dilator and a peel-away sheath.  The dilator and the guidewire were removed and the peel-away sheath was  placed through Port-A-cath tubing.  The peel-away sheath  was removed,  which confirmed  th tubing being  in the SVC.  It withdrew blood easily.  For this reason, wound was closed with 3-0 Vicryl and Dermabond for the  skin.  We then turned our attention to the other  Port-A-cath site.  This was  dissected out and removed, the stitch being cut and then the  wound was closed.  We also cultured the wound.  Wound was closed with 3-  0 Vicryl and Dermabond for the skin.  The patient returned to the  recovery room in stable condition.      Ines Bloomer, M.D.  Electronically Signed     DPB/MEDQ  D:  09/14/2008  T:  09/15/2008  Job:  [id number]
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
## 2010-10-24 NOTE — Assessment & Plan Note (Signed)
OFFICE VISIT   Dominique Rhodes, Dominique Rhodes  DOB:  Jul 15, 1985                                        Oct 28, 2008  CHART #:  [id number]   She came today and her right Port-A-Cath wound is opened a little more,  but it is clean.  We cleaned with peroxide and put a dry dressing on and  we will see her back again in 1 week.  Her blood pressure is 123/79,  pulse 96, respirations 18, sats were 96%.   Ines Bloomer, M.D.  Electronically Signed   DPB/MEDQ  D:  10/28/2008  T:  10/29/2008  Job:  [id number]

## 2010-11-06 IMAGING — CR DG CHEST 1V PORT
1 series · 1 of 1 positions shown · non-contrast
Comparison: 06/14/2008 and CT chest 05/14/2008

CLINICAL DATA: Post mediastinoscopy.

PORTABLE CHEST - 1 VIEW

[view not recorded]
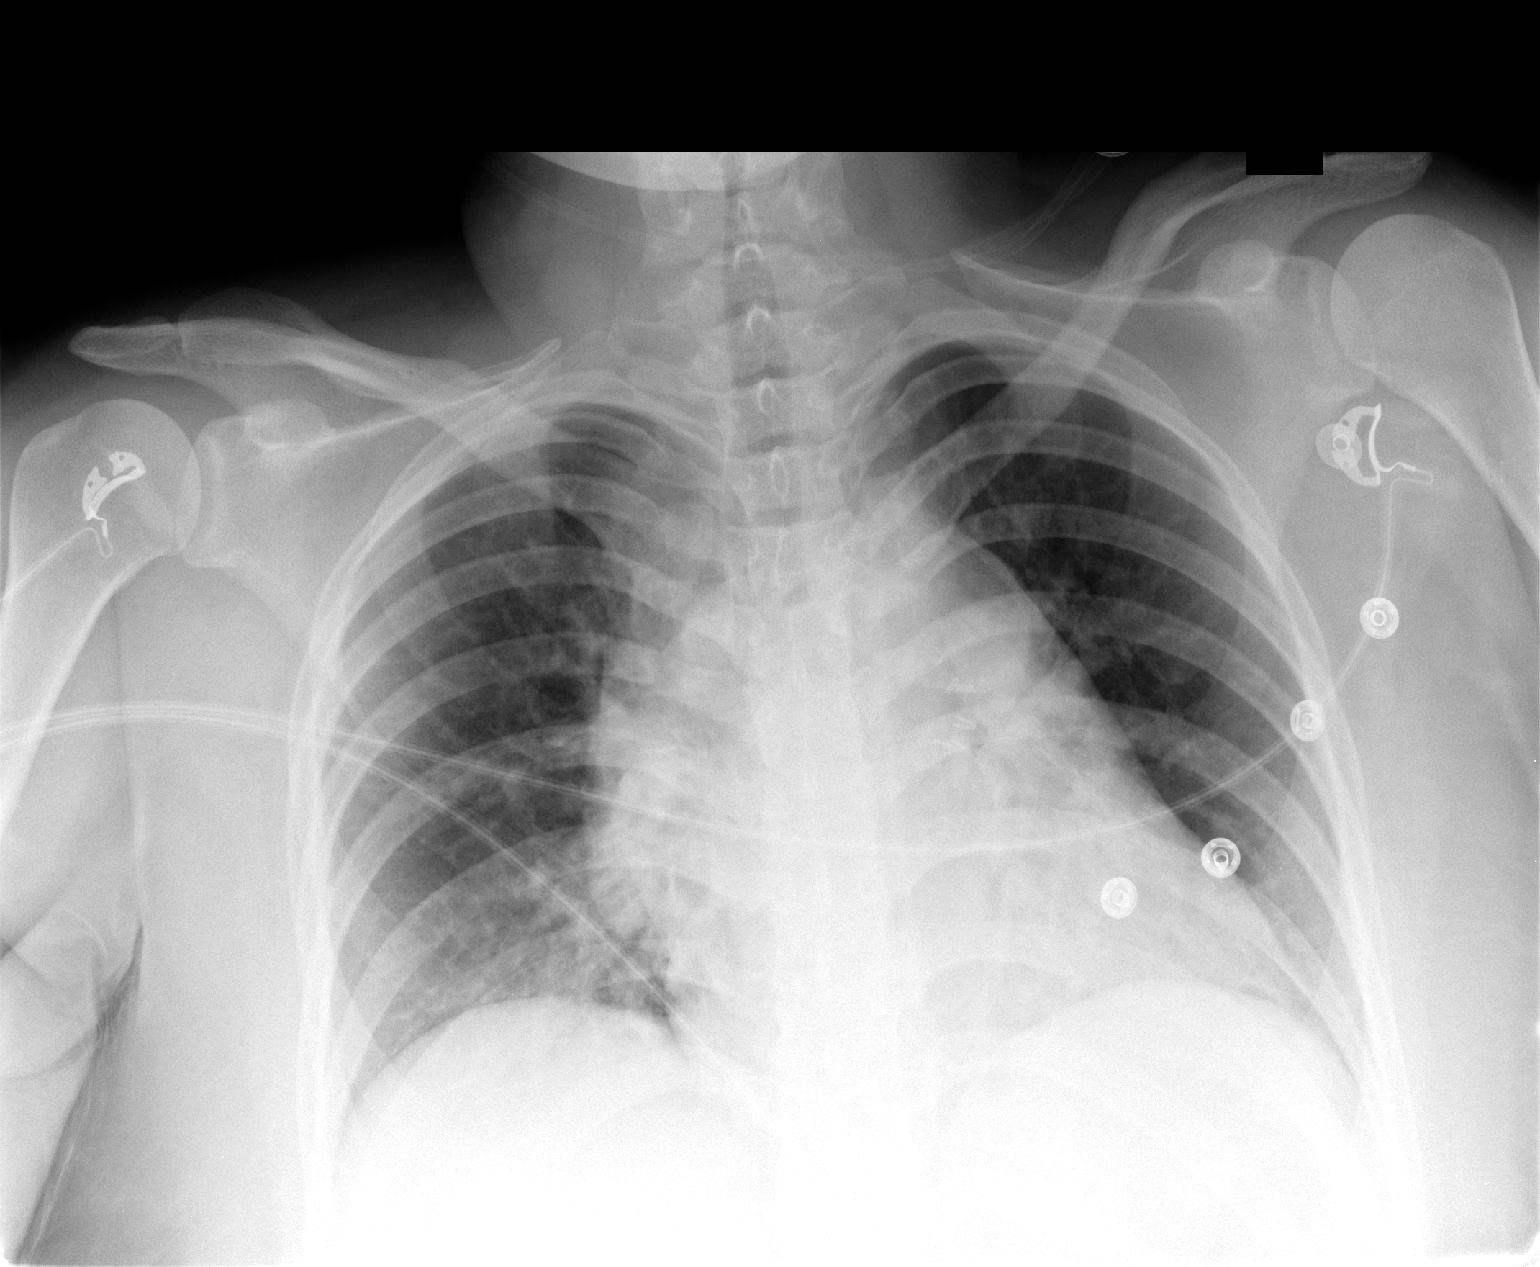

[1 of 1 positions shown; findings below may reference images not displayed]

FINDINGS: Trachea is midline.  Heart size normal.  Mediastinal mass
is again seen with surgical clips projecting over the left mainstem
bronchus.  Lungs are somewhat low in volume with bibasilar
atelectasis.  No pneumothorax.
IMPRESSION: No pneumothorax after mediastinoscopy for anterior mediastinal
mass.  Bibasilar atelectasis.

## 2010-11-06 IMAGING — CR DG CHEST 2V
2 series · 2 of 2 positions shown · non-contrast
Comparison: 06/01/2008

CLINICAL DATA: Preop for mediastinal mass

CHEST - 2 VIEW

[view not recorded (1 of 2)]
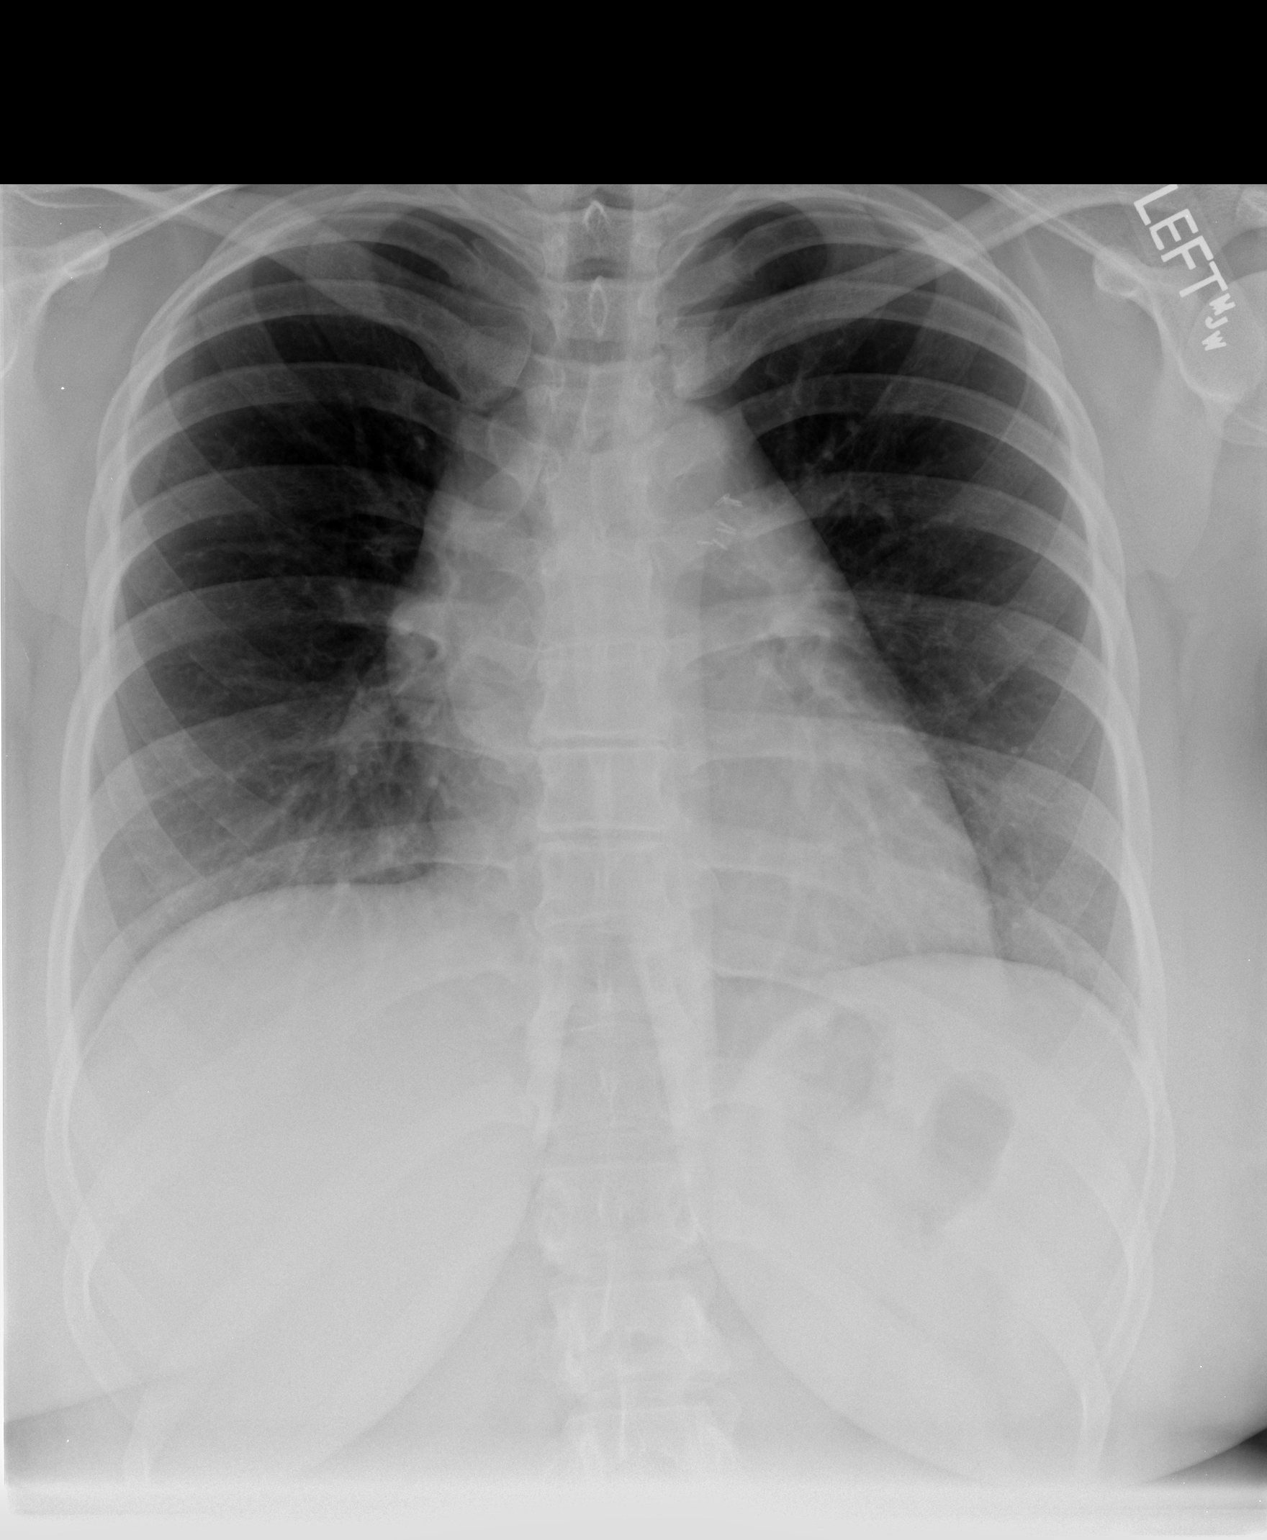

[view not recorded (2 of 2)]
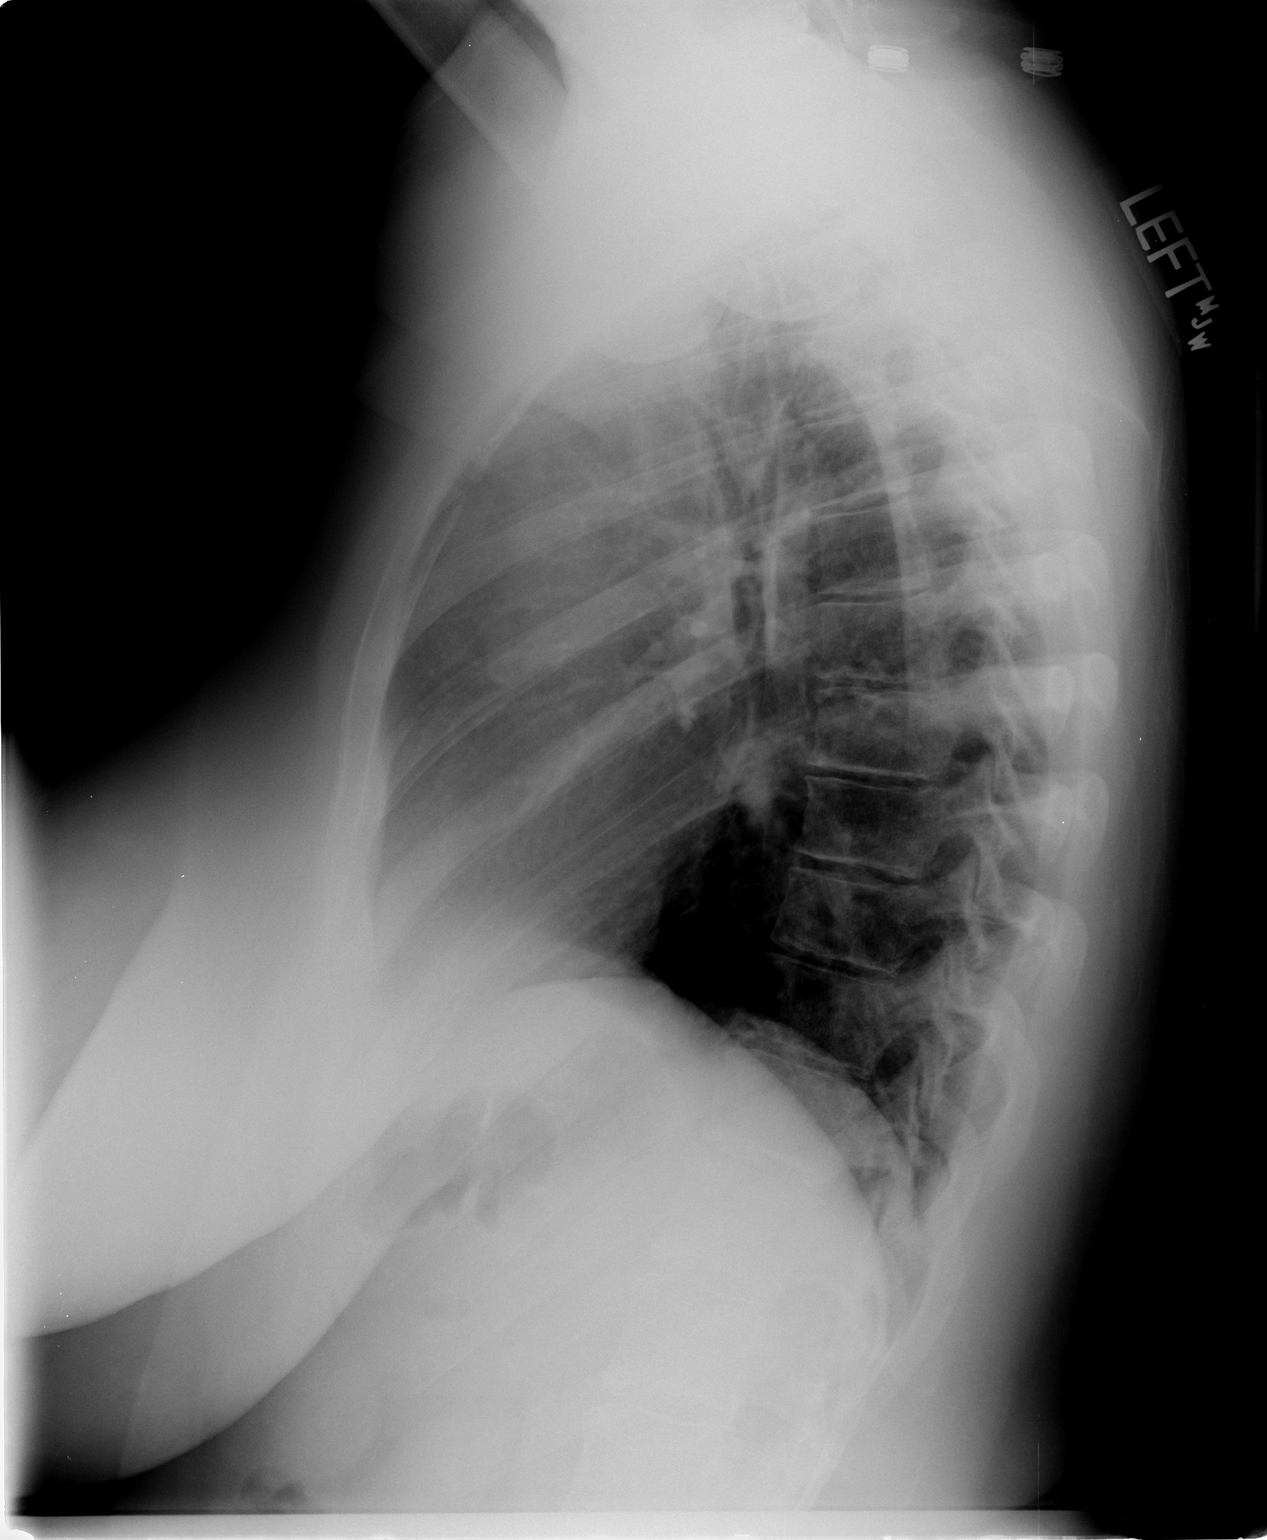

[2 of 2 positions shown; findings below may reference images not displayed]

FINDINGS: Stable to slightly larger anterior mediastinal mass.
There are surgical clips in the left mediastinum, as before.

Lungs clear.  No osseous lesions.
IMPRESSION: Anterior mediastinal mass stable to slightly larger.  Lungs clear.

## 2011-03-16 LAB — COMPREHENSIVE METABOLIC PANEL
ALT: 17 U/L (ref 0–35)
AST: 15 U/L (ref 0–37)
Albumin: 3.8 g/dL (ref 3.5–5.2)
Albumin: 4 g/dL (ref 3.5–5.2)
Alkaline Phosphatase: 68 U/L (ref 39–117)
Alkaline Phosphatase: 71 U/L (ref 39–117)
BUN: 14 mg/dL (ref 6–23)
CO2: 22 mEq/L (ref 19–32)
Chloride: 108 mEq/L (ref 96–112)
GFR calc Af Amer: >60 mL/min (ref >60)
Glucose, Bld: 93 mg/dL (ref 70–99)
Potassium: 3.6 mEq/L (ref 3.5–5.1)
Potassium: 4 mEq/L (ref 3.5–5.1)
Sodium: 137 mEq/L (ref 135–145)
Total Bilirubin: 0.4 mg/dL (ref 0.3–1.2)
Total Protein: 7 g/dL (ref 6.0–8.3)

## 2011-03-16 LAB — BLOOD GAS, ARTERIAL
Bicarbonate: 25.7 mEq/L — ABNORMAL HIGH (ref 20.0–24.0)
FIO2: 0.21 %
Patient temperature: 98.6
pH, Arterial: 7.436 — ABNORMAL HIGH (ref 7.350–7.400)

## 2011-03-16 LAB — TYPE AND SCREEN
ABO/RH(D): A NEG
Antibody Screen: NEGATIVE
Antibody Screen: NEGATIVE

## 2011-03-16 LAB — CBC
HCT: 36.3 % (ref 36.0–46.0)
Hemoglobin: 11.8 g/dL — ABNORMAL LOW (ref 12.0–15.0)
Hemoglobin: 12.4 g/dL (ref 12.0–15.0)
MCHC: 34.3 g/dL (ref 30.0–36.0)
Platelets: 244 10*3/uL (ref 150–400)
RBC: 4.55 MIL/uL (ref 3.87–5.11)
RBC: 4.29 MIL/uL (ref 3.87–5.11)
RDW: 12.7 % (ref 11.5–15.5)
RDW: 13.4 % (ref 11.5–15.5)
WBC: 5.9 10*3/uL (ref 4.0–10.5)

## 2011-03-16 LAB — POCT I-STAT, CHEM 8
Chloride: 104 meq/L (ref 96–112)
HCT: 39 % (ref 36.0–46.0)
Potassium: 3.6 meq/L (ref 3.5–5.1)

## 2011-03-16 LAB — ABO/RH: ABO/RH(D): A NEG

## 2011-03-16 LAB — URINALYSIS, ROUTINE W REFLEX MICROSCOPIC
Glucose, UA: NEGATIVE mg/dL
Hgb urine dipstick: NEGATIVE
Specific Gravity, Urine: 1.028 (ref 1.005–1.030)

## 2011-03-16 LAB — PROTIME-INR
INR: 1 (ref 0.00–1.49)
INR: 1 (ref 0.00–1.49)
Prothrombin Time: 13.1 seconds (ref 11.6–15.2)

## 2011-03-16 LAB — GLUCOSE, CAPILLARY: Glucose-Capillary: 98 mg/dL (ref 70–99)

## 2011-04-26 ENCOUNTER — Telehealth: Payer: Self-pay | Admitting: Internal Medicine

## 2011-04-26 NOTE — Telephone Encounter (Signed)
Called pt to advise, 12/5 md appt has been cx'd due to epic. Voice recording came on advising the pt is unavailable at this time. Unable to leave voicemail.

## 2011-05-10 ENCOUNTER — Other Ambulatory Visit: Payer: 59 | Admitting: Lab

## 2011-05-10 ENCOUNTER — Other Ambulatory Visit (HOSPITAL_COMMUNITY): Payer: Self-pay

## 2011-05-13 ENCOUNTER — Telehealth: Payer: Self-pay | Admitting: Internal Medicine

## 2011-05-13 NOTE — Telephone Encounter (Signed)
lmonvm for pt re new appt for 1/16 @ 11 am. R/s from dec due to EPIC. Jan schedule mailed today.

## 2011-06-27 ENCOUNTER — Telehealth: Payer: Self-pay | Admitting: Internal Medicine

## 2011-06-27 ENCOUNTER — Ambulatory Visit: Payer: 59 | Admitting: Internal Medicine

## 2011-06-27 NOTE — Progress Notes (Signed)
No show

## 2011-06-27 NOTE — Telephone Encounter (Signed)
Pt  Was a no show today for appointment . I called and left message to return call

## 2011-07-04 IMAGING — CT CT PELVIS W/ CM
1 series · 15 of 32 positions shown, 19 images · IV contrast (agent unspecified)
Comparison: 11/10/2008

CT CHEST

CLINICAL DATA: Hodgkin's disease.

CT CHEST, ABDOMEN AND PELVIS WITH CONTRAST
TECHNIQUE: Multidetector CT imaging of the chest, abdomen and
pelvis was performed following the standard protocol during bolus
administration of intravenous contrast.
Contrast: 100 ml [id number]-NFF

[Series 2: cap with st · axial · 0.94mm/px · z∈[+750,+1330]mm · 15 of 130 slices shown, 19 images]
[im 9/130  soft-tissue]
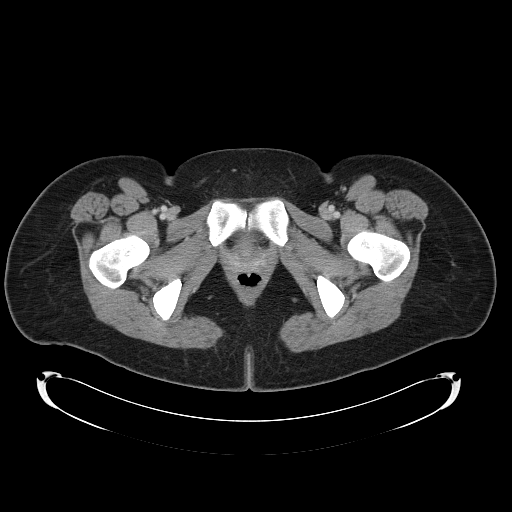
[im 9/130  bone]
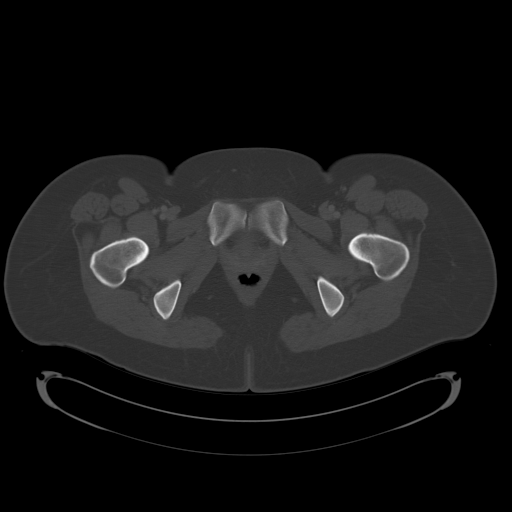
[im 17/130  soft-tissue]
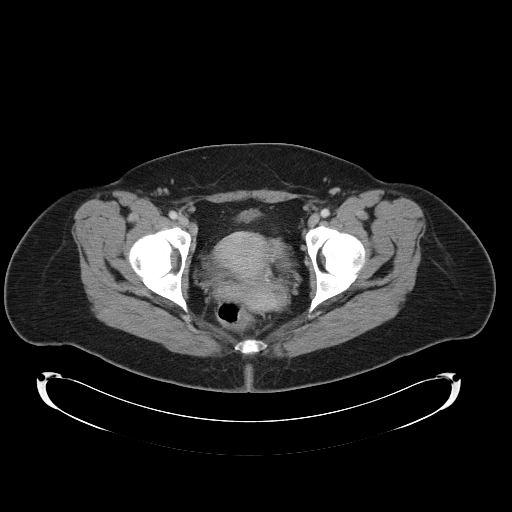
[im 25/130  soft-tissue]
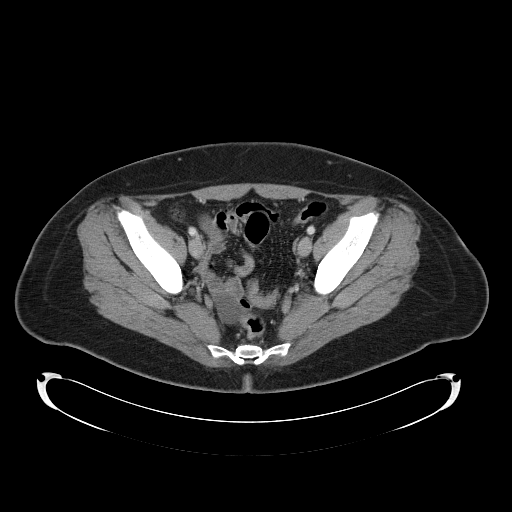
[im 38/130  soft-tissue]
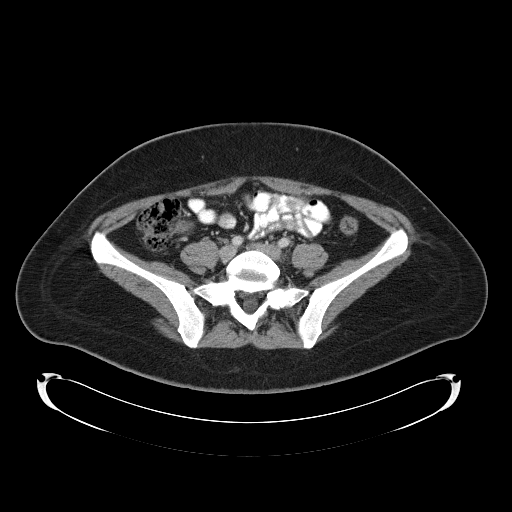
[im 46/130  soft-tissue]
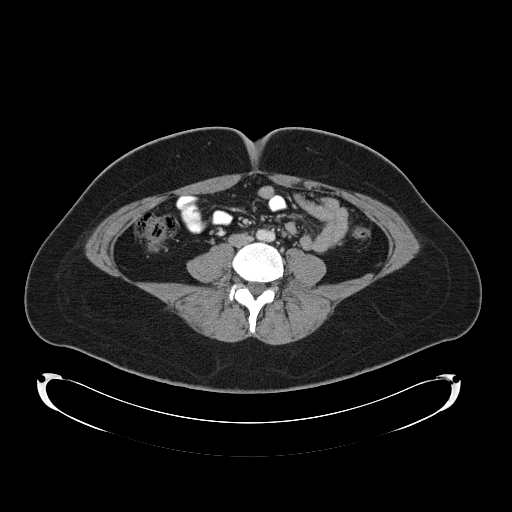
[im 55/130  soft-tissue]
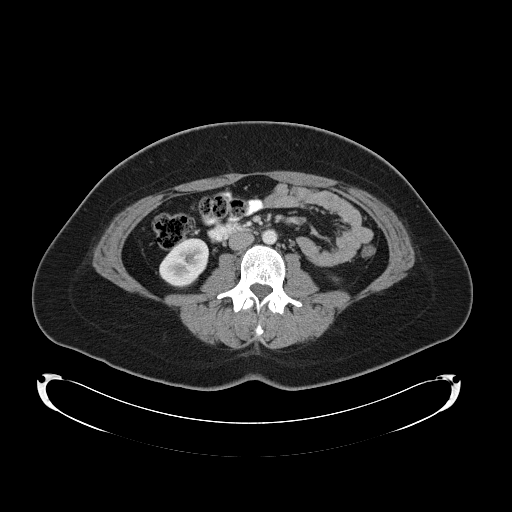
[im 67/130  soft-tissue]
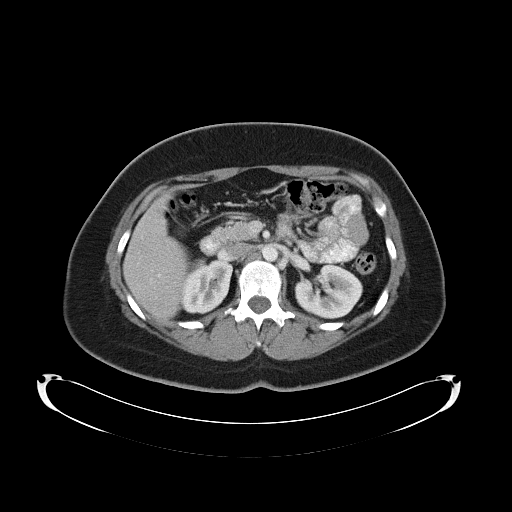
[im 75/130  soft-tissue]
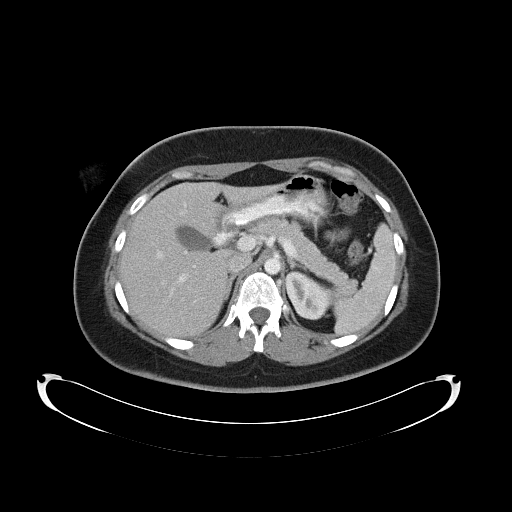
[im 84/130  soft-tissue]
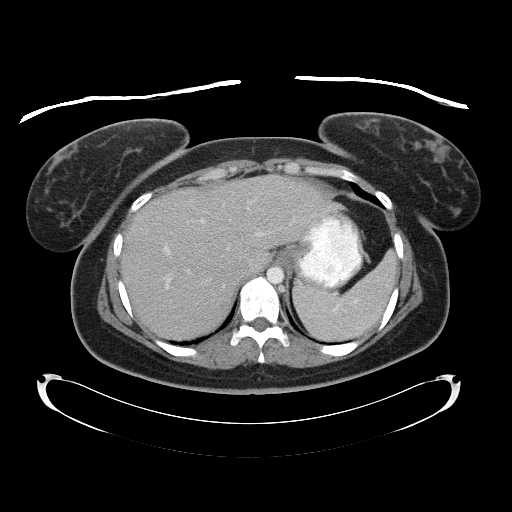
[im 84/130  bone]
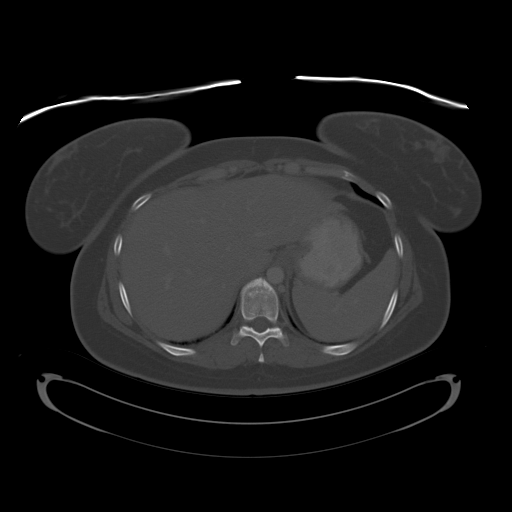
[im 92/130  soft-tissue]
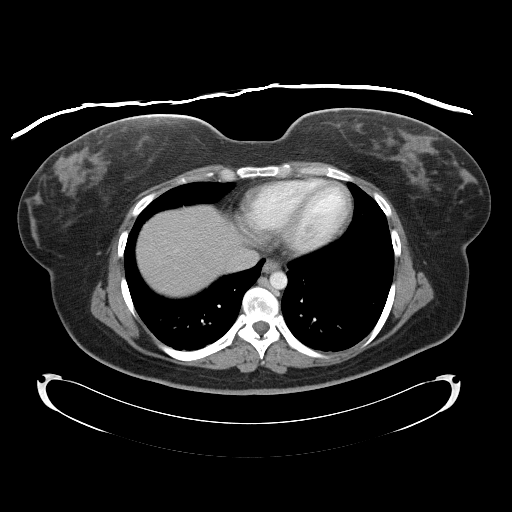
[im 105/130  soft-tissue]
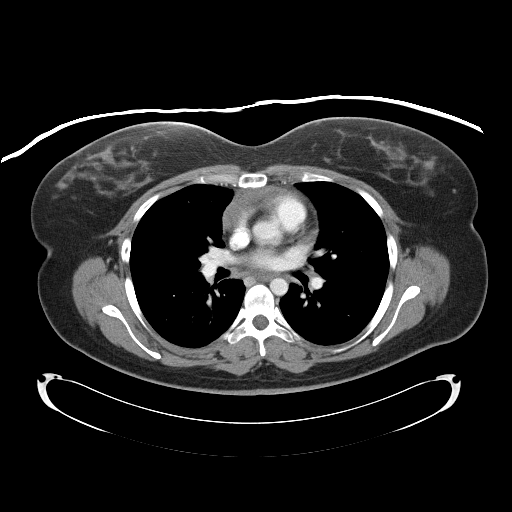
[im 113/130  soft-tissue]
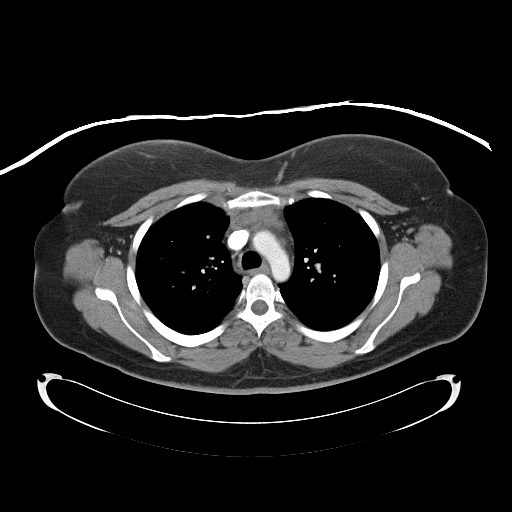
[im 113/130  lung]
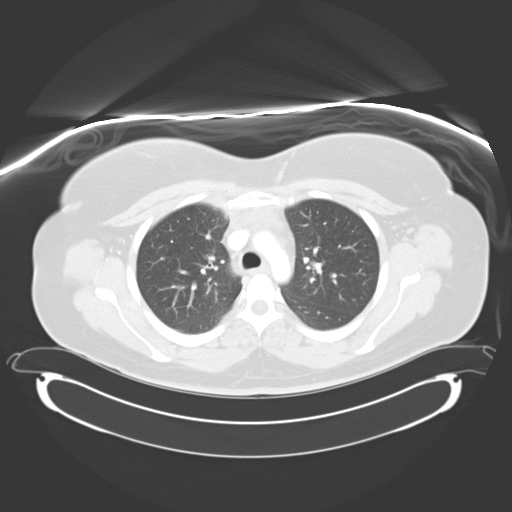
[im 117/130  lung]
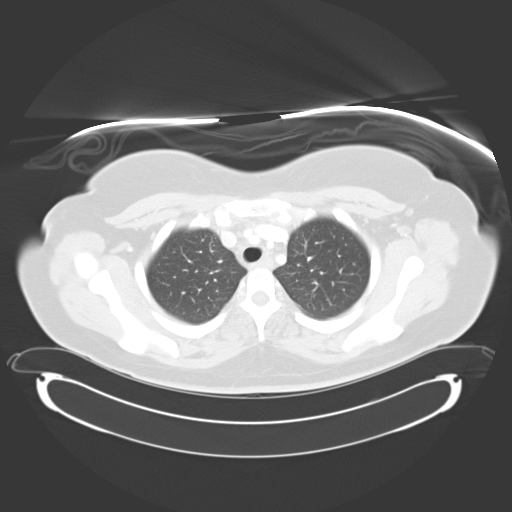
[im 121/130  soft-tissue]
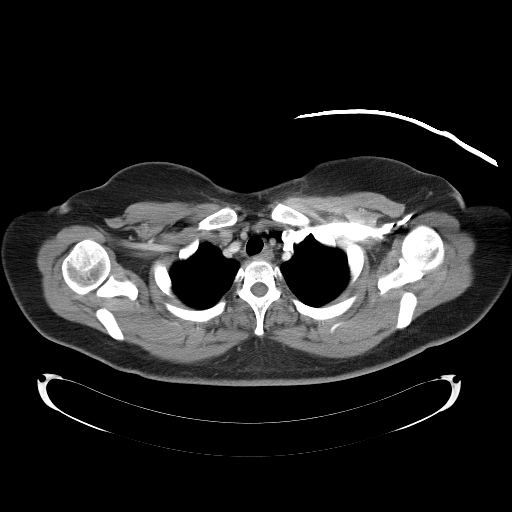
[im 121/130  lung]
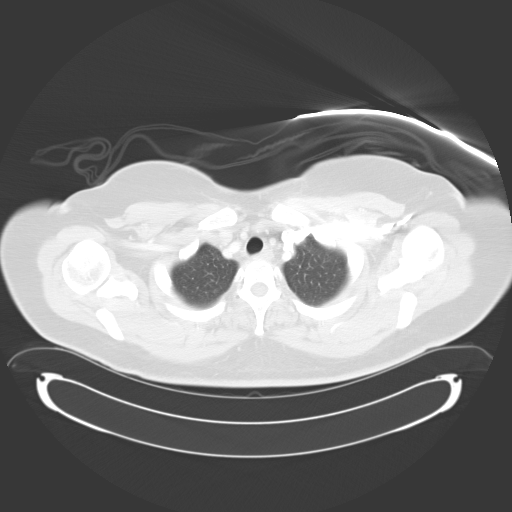
[im 125/130  lung]
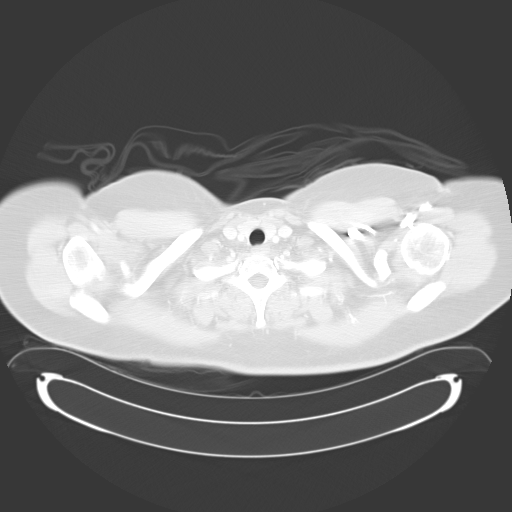

[15 of 32 positions shown; findings below may reference images not displayed]

FINDINGS: The thoracic inlet appears unremarkable.

Anterior mediastinal density appears relatively similar to the
recent PET CT, and markedly improved compared to the prior CT scan
of 05/14/2008.  On image 24 of series #2, the anterior mediastinal
soft tissue (which may partially or completely represent thymic
tissue) measures 6.4 x 2.0 cm (formerly 12.2 x 4.6 cm).  No
pericardial effusion is identified.  No pathologic thoracic
adenopathy noted.

There is mild dependent subsegmental atelectasis in both lower
lobes.

The lungs appear otherwise clear.

No specific vascular abnormality identified.
IMPRESSION: 1.  Residual anterior mediastinal soft tissue density may partially
or completely represent thymic tissue.  Although this is more
prominent than in a normal patient, the overall anterior
mediastinal soft tissue density is markedly reduced in size
compared the prior CT and relatively similar to that noted on the
recent PET CT when similar locations are compared.

CT ABDOMEN
FINDINGS: The liver, spleen, pancreas, and adrenal glands appear
unremarkable.

The gallbladder and biliary system appear unremarkable.

No pathologic retroperitoneal or porta hepatis adenopathy is
identified.

The kidneys appear unremarkable, as do the proximal ureters.

No dilated bowel or discrete bowel abnormality is identified.
IMPRESSION: 1.  No specific findings of upper abdominal malignancy.

CT PELVIS
FINDINGS: A hypodense lesion along the posterior margin of the
right adnexa measures 3.7 x 2.9 cm, and likely represents a right
ovarian cyst.  This is increased in size from prior measurement of
2.8 x 2.2 cm.

The left ovary and uterine contour appears normal.

No pathologic pelvic adenopathy is identified.

No free pelvic fluid noted.  Urinary bladder appears unremarkable.
IMPRESSION: 1.  Right ovarian cyst, increased in size compared to the prior
exam.
2.  No specific findings of pelvic lymphoma.

## 2011-07-06 ENCOUNTER — Telehealth: Payer: Self-pay | Admitting: Internal Medicine

## 2011-07-06 NOTE — Telephone Encounter (Signed)
pt called to r/s pet from 11/12 and mkm appt from 1/13,both r/s and pt is aware   aom

## 2011-07-27 ENCOUNTER — Telehealth: Payer: Self-pay | Admitting: Internal Medicine

## 2011-07-27 NOTE — Telephone Encounter (Signed)
pt had called and l/m to r/s 2/18 pet.  appt r/s to 2/25 and mkm f/u set for 2/27,pt aware     aom

## 2011-07-30 ENCOUNTER — Inpatient Hospital Stay (HOSPITAL_COMMUNITY): Admission: RE | Admit: 2011-07-30 | Payer: 59 | Source: Ambulatory Visit

## 2011-08-01 ENCOUNTER — Ambulatory Visit: Payer: 59 | Admitting: Internal Medicine

## 2011-08-06 ENCOUNTER — Encounter (HOSPITAL_COMMUNITY)
Admission: RE | Admit: 2011-08-06 | Discharge: 2011-08-06 | Disposition: A | Discharge status: Home / Self Care | Payer: Self-pay | Source: Ambulatory Visit | Attending: Internal Medicine | Admitting: Internal Medicine

## 2011-08-06 DIAGNOSIS — C8589 Other specified types of non-Hodgkin lymphoma, extranodal and solid organ sites: Secondary | ICD-10-CM | POA: Insufficient documentation

## 2011-08-06 DIAGNOSIS — C8590 Non-Hodgkin lymphoma, unspecified, unspecified site: Secondary | ICD-10-CM

## 2011-08-06 LAB — GLUCOSE, CAPILLARY: Glucose-Capillary: 97 mg/dL (ref 70–99)

## 2011-08-06 MED ORDER — FLUDEOXYGLUCOSE F - 18 (FDG) INJECTION: 17.4000 | Freq: Once | INTRAVENOUS | Status: AC | PRN

## 2011-08-08 ENCOUNTER — Telehealth: Payer: Self-pay | Admitting: Internal Medicine

## 2011-08-08 ENCOUNTER — Ambulatory Visit (HOSPITAL_BASED_OUTPATIENT_CLINIC_OR_DEPARTMENT_OTHER): Payer: Self-pay | Admitting: Internal Medicine

## 2011-08-08 VITALS — BP 132/77 | HR 89 | Temp 97.1°F | Ht 68.0 in | Wt 236.9 lb

## 2011-08-08 DIAGNOSIS — C8190 Hodgkin lymphoma, unspecified, unspecified site: Secondary | ICD-10-CM

## 2011-08-08 NOTE — Progress Notes (Signed)
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  PRIOR THERAPY: 1. Status post 4 cycles of systemic chemotherapy with ABVD given on days 1 and 15 every 4 weeks.  Last dose was given Oct 25, 2008. 2. Status post involved field radiotherapy under the care of Dr Michell Heinrich between December 01, 2008, through December 22, 2008.  CURRENT THERAPY:  Observation.  INTERVAL HISTORY: Dominique Rhodes 26 y.o. female returns to the clinic today for annual followup visit. The patient has no complaints today. She denied having any significant fever or chills, no weight loss or night sweats. She has no palpable lymphadenopathy. No chest pain or shortness of breath. She has repeat PET scan performed recently and she is here today for evaluation and discussion of her scan results.   ALLERGIES:  is allergic to contrast media.  MEDICATIONS:  No current outpatient prescriptions on file.    REVIEW OF SYSTEMS:  A comprehensive review of systems was negative.   PHYSICAL EXAMINATION: General appearance: alert, cooperative and no distress Neck: no adenopathy Lymph nodes: Cervical, supraclavicular, and axillary nodes normal. Resp: clear to auscultation bilaterally Cardio: regular rate and rhythm, S1, S2 normal, no murmur, click, rub or gallop GI: soft, non-tender; bowel sounds normal; no masses,  no organomegaly Extremities: extremities normal, atraumatic, no cyanosis or edema Neurologic: Alert and oriented X 3, normal strength and tone. Normal symmetric reflexes. Normal coordination and gait  ECOG PERFORMANCE STATUS: 0 - Asymptomatic  Blood pressure 132/77, pulse 89, temperature 97.1 F (36.2 C), temperature source Oral, height 5\' 8"  (1.727 m), weight 236 lb 14.4 oz (107.457 kg), last menstrual period 07/02/2011.  LABORATORY DATA: Lab Results  Component Value Date   WBC 4.9 05/16/2010   HGB 13.2 05/16/2010   HCT 37.7 05/16/2010   MCV 86.9 05/16/2010   PLT 197 05/16/2010      Chemistry      Component Value Date/Time   NA 138 05/16/2010 1518   K 3.8 05/16/2010 1518   CL 104 05/16/2010 1518   CO2 27 05/16/2010 1518   BUN 11 05/16/2010 1518   CREATININE 0.47 05/16/2010 1518      Component Value Date/Time   CALCIUM 9.3 05/16/2010 1518   ALKPHOS 53 05/16/2010 1518   AST 20 05/16/2010 1518   ALT 22 05/16/2010 1518   BILITOT 0.5 05/16/2010 1518       RADIOGRAPHIC STUDIES: Nm Pet Image Restag (ps) Skull Base To Thigh  08/06/2011  *RADIOLOGY REPORT*  Clinical Data: Subsequent treatment strategy for history of Hodgkin's lymphoma.  Restaging. Chemotherapy Oct 16, 2008.  NUCLEAR MEDICINE PET CT SKULL BASE TO THIGH  Technique:  17.4 mCi F-18 FDG was injected intravenously via the left AC.  Full-ring PET imaging was performed from the skull base through the mid-thighs 75   minutes after injection.  CT data was obtained and used for attenuation correction and anatomic localization only.  (This was not acquired as a diagnostic CT examination.)  Fasting Blood Glucose:  97  Patient Weight:  210 pounds.  Comparison: Most recent CTs of 05/17/2011.  Most recent PET of 11/10/2008.  Findings: PET images demonstrate no abnormal activity within the neck, chest, abdomen, or pelvis to suggest hypermetabolic recurrent lymphoma.  Physiologic hypermetabolism within the nasal phalanx and palatine tonsil regions bilaterally.  CT images performed for attenuation correction demonstrate right- sided jugulodigastric nodes which measure up to  8 mm and are similar to slightly decreased since the prior.  Not pathologic by size criteria.  No thoracic adenopathy.  Anterior mediastinal fascial thickening with surgical clips within the left side of the prevascular space. No thoracic adenopathy.  Small volume cul-de-sac fluid is new since 05/16/2010, but favored to be physiologic.  IMPRESSION: No evidence of recurrent or residual  hypermetabolic lymphoma.  Original Report Authenticated By: Consuello Bossier, M.D.    ASSESSMENT: This is a very pleasant 26 years old white female with a stage II Hodgkin's lymphoma status post chemotherapy with ABVD followed by involved field radiation. She has been observation for the last 3 years with no evidence for disease recurrence.  PLAN: I discussed the scan results with the patient today and recommended for her continuous observation with repeat PET scan as well as blood work in one year. She was advised to call me immediately if she has any concerning symptoms in the interval.  All questions were answered. The patient knows to call the clinic with any problems, questions or concerns. We can certainly see the patient much sooner if necessary.

## 2011-08-08 NOTE — Telephone Encounter (Signed)
appts made for 2014,pt placed in phone aom

## 2012-02-07 ENCOUNTER — Telehealth: Payer: Self-pay | Admitting: Radiation Oncology

## 2012-02-07 ENCOUNTER — Ambulatory Visit: Admission: RE | Admit: 2012-02-07 | Payer: 59 | Source: Ambulatory Visit | Admitting: Radiation Oncology

## 2012-02-07 NOTE — Telephone Encounter (Signed)
Patient has not shown for scheduled follow up. Phoned number listed for home/mobile. No answer. Left message requesting return call to reschedule.

## 2012-02-14 ENCOUNTER — Encounter: Payer: Self-pay | Admitting: *Deleted

## 2012-03-06 ENCOUNTER — Ambulatory Visit
Admission: RE | Admit: 2012-03-06 | Discharge: 2012-03-06 | Disposition: A | Discharge status: Home / Self Care | Payer: Self-pay | Source: Ambulatory Visit | Attending: Radiation Oncology | Admitting: Radiation Oncology

## 2012-03-06 VITALS — BP 126/61 | HR 96 | Temp 97.2°F | Resp 18 | Wt 249.6 lb

## 2012-03-06 DIAGNOSIS — C8192 Hodgkin lymphoma, unspecified, intrathoracic lymph nodes: Secondary | ICD-10-CM

## 2012-03-06 NOTE — Progress Notes (Signed)
   Department of Radiation Oncology  Phone:  [phone] Fax:        [phone]   Name: Dominique Rhodes   DOB: 11-Oct-1985  MRN: [id number]    Date: 03/06/2012  Follow Up Visit Note  Diagnosis: Stage II Hodgkin's lymphoma  Interval since last radiation: 3 years  Interval History: Dominique Rhodes presents today for routine followup.  She is feeling well and doing well. She just returned from a trip to [place]. She is planning a wedding in August. 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 She is palpated a right breast mass and was hoping I could examine that. He had a negative PET scan in February. She reports no pain associated with this mass. She did just come off of her menstrual cycle. She is considering breast reduction surgery. She reports no other swelling fevers or chills. She continues to gain weight.  Allergies:  Allergies  Allergen Reactions  . Contrast Media (Iodinated Diagnostic Agents)     Medications:  No current outpatient prescriptions on file.    Physical Exam:   weight is 249 lb 9.6 oz (113.218 kg). Her oral temperature is 97.2 F (36.2 C). Her blood pressure is 126/61 and her pulse is 96. Her respiration is 18.  She has large pendulous breasts bilaterally. The area of concern to her is in the medial aspect of the right breast. I palpate no masses. She has no palpable axillary supraclavicular or cervical adenopathy.  IMPRESSION: Dominique Rhodes is a 26 y.o. female 3 years out from radiation for a stage II Hodgkin's lymphoma with no evidence of disease  PLAN:  Dominique Rhodes looks great. I released her from followup with me. We again discussed beginning her bilateral mammograms and breast MRI in 2020. She is regular schedule followup Dr. [name]. I be happy to see her back on a as-needed basis.    Lurline Hare, MD

## 2012-03-06 NOTE — Progress Notes (Signed)
Received patient in the clinic today for a follow up appointment with Dr. Michell Heinrich. Patient is alert and oriented to person, place, and time. No distress noted. Steady gait noted. Pleasant affect noted. Patient denies pain at this time. Patient report that within a month she will get 3-4 headache. Patient report that this is increased frequency because she has never been one to have a headache. Patient reports intensity of 5 on a scale of 0-10 related to the headache. She reports taking tylenol to relieve these headaches. Patient denies nausea, vomiting, or dizziness. Patient has stopped acupuncture. Patient reports a normal energy level. Patient continues to gain weight but, attributes this to her diet. Patient denies cough and shortness of breath. Patient denies difficulty sleeping. Patient concerned she has found a lump in her right breast. Patient denies warmth, tenderness or nipple discharge in her right breast. Reported all findings to Dr. Michell Heinrich.

## 2012-08-05 ENCOUNTER — Encounter (HOSPITAL_COMMUNITY): Admission: RE | Admit: 2012-08-05 | Payer: Self-pay | Source: Ambulatory Visit

## 2012-08-06 ENCOUNTER — Other Ambulatory Visit: Payer: Self-pay | Admitting: *Deleted

## 2012-08-07 ENCOUNTER — Ambulatory Visit: Payer: Self-pay | Admitting: Internal Medicine

## 2012-08-13 ENCOUNTER — Telehealth: Payer: Self-pay | Admitting: Medical Oncology

## 2012-08-13 NOTE — Telephone Encounter (Signed)
I called pt phone numbers and none are active numbers .-

## 2013-10-09 ENCOUNTER — Other Ambulatory Visit: Payer: Self-pay | Admitting: Nurse Practitioner

## 2013-10-09 DIAGNOSIS — N63 Unspecified lump in unspecified breast: Principal | ICD-10-CM

## 2013-10-12 ENCOUNTER — Ambulatory Visit
Admission: RE | Admit: 2013-10-12 | Discharge: 2013-10-12 | Disposition: A | Discharge status: Home / Self Care | Payer: PRIVATE HEALTH INSURANCE | Source: Ambulatory Visit | Attending: Nurse Practitioner | Admitting: Nurse Practitioner

## 2013-10-12 ENCOUNTER — Encounter (INDEPENDENT_AMBULATORY_CARE_PROVIDER_SITE_OTHER): Payer: Self-pay

## 2013-10-12 DIAGNOSIS — N63 Unspecified lump in unspecified breast: Principal | ICD-10-CM

## 2016-10-12 ENCOUNTER — Other Ambulatory Visit: Payer: Self-pay | Admitting: Endocrinology

## 2016-10-12 DIAGNOSIS — Z923 Personal history of irradiation: Principal | ICD-10-CM

## 2016-10-22 ENCOUNTER — Ambulatory Visit
Admission: RE | Admit: 2016-10-22 | Discharge: 2016-10-22 | Disposition: A | Discharge status: Home / Self Care | Payer: No Typology Code available for payment source | Source: Ambulatory Visit | Attending: Endocrinology | Admitting: Endocrinology

## 2016-10-22 DIAGNOSIS — Z923 Personal history of irradiation: Principal | ICD-10-CM

## 2018-11-27 ENCOUNTER — Telehealth: Payer: Self-pay | Admitting: Internal Medicine

## 2018-11-27 NOTE — Telephone Encounter (Signed)
A new patient appt has been scheduled for the pt to see Dr. Julien Nordmann on 7/2 at 1130am w/labs at 11am. I cld and spoke to Ms. Ress, whose maiden name is Tokic, who has agreed to the appt date and time. Msg fwd to HIM to have the charts merged.

## 2018-12-11 ENCOUNTER — Inpatient Hospital Stay: Payer: 59 | Attending: Internal Medicine | Admitting: Internal Medicine

## 2018-12-11 ENCOUNTER — Other Ambulatory Visit: Payer: Self-pay

## 2018-12-11 ENCOUNTER — Encounter: Payer: Self-pay | Admitting: Internal Medicine

## 2018-12-11 ENCOUNTER — Inpatient Hospital Stay: Payer: 59

## 2018-12-11 VITALS — BP 126/78 | HR 102 | Temp 97.5°F | Ht 68.0 in | Wt 272.2 lb

## 2018-12-11 DIAGNOSIS — E039 Hypothyroidism, unspecified: Secondary | ICD-10-CM | POA: Insufficient documentation

## 2018-12-11 DIAGNOSIS — N840 Polyp of corpus uteri: Secondary | ICD-10-CM | POA: Insufficient documentation

## 2018-12-11 DIAGNOSIS — F418 Other specified anxiety disorders: Secondary | ICD-10-CM | POA: Insufficient documentation

## 2018-12-11 DIAGNOSIS — Z8051 Family history of malignant neoplasm of kidney: Secondary | ICD-10-CM | POA: Insufficient documentation

## 2018-12-11 DIAGNOSIS — F32A Depression, unspecified: Secondary | ICD-10-CM | POA: Insufficient documentation

## 2018-12-11 DIAGNOSIS — Z807 Family history of other malignant neoplasms of lymphoid, hematopoietic and related tissues: Secondary | ICD-10-CM | POA: Insufficient documentation

## 2018-12-11 DIAGNOSIS — R221 Localized swelling, mass and lump, neck: Secondary | ICD-10-CM

## 2018-12-11 DIAGNOSIS — C8112 Nodular sclerosis classical Hodgkin lymphoma, intrathoracic lymph nodes: Secondary | ICD-10-CM

## 2018-12-11 DIAGNOSIS — Z90721 Acquired absence of ovaries, unilateral: Secondary | ICD-10-CM

## 2018-12-11 DIAGNOSIS — F329 Major depressive disorder, single episode, unspecified: Secondary | ICD-10-CM | POA: Insufficient documentation

## 2018-12-11 DIAGNOSIS — F419 Anxiety disorder, unspecified: Secondary | ICD-10-CM | POA: Insufficient documentation

## 2018-12-11 DIAGNOSIS — Z8571 Personal history of Hodgkin lymphoma: Secondary | ICD-10-CM | POA: Diagnosis present

## 2018-12-11 DIAGNOSIS — Z87891 Personal history of nicotine dependence: Secondary | ICD-10-CM | POA: Diagnosis not present

## 2018-12-11 LAB — CMP (CANCER CENTER ONLY)
ALT: 59 U/L — ABNORMAL HIGH (ref 0–44)
AST: 41 U/L (ref 15–41)
Albumin: 4.1 g/dL (ref 3.5–5.0)
Alkaline Phosphatase: 48 U/L (ref 38–126)
Anion gap: 9 (ref 5–15)
BUN: 13 mg/dL (ref 6–20)
CO2: 23 mmol/L (ref 22–32)
Calcium: 9.2 mg/dL (ref 8.9–10.3)
Chloride: 104 mmol/L (ref 98–111)
Creatinine: 0.72 mg/dL (ref 0.44–1.00)
GFR, Est AFR Am: >60 mL/min (ref >60)
GFR, Estimated: >60 mL/min (ref >60)
Glucose, Bld: 91 mg/dL (ref 70–99)
Potassium: 4.2 mmol/L (ref 3.5–5.1)
Sodium: 136 mmol/L (ref 135–145)
Total Bilirubin: 0.5 mg/dL (ref 0.3–1.2)
Total Protein: 8.1 g/dL (ref 6.5–8.1)

## 2018-12-11 LAB — CBC WITH DIFFERENTIAL (CANCER CENTER ONLY)
Abs Immature Granulocytes: 0.01 10*3/uL (ref 0.00–0.07)
Basophils Absolute: 0 10*3/uL (ref 0.0–0.1)
Basophils Relative: 0 %
Eosinophils Absolute: 0.1 10*3/uL (ref 0.0–0.5)
Eosinophils Relative: 3 %
HCT: 37.9 % (ref 36.0–46.0)
Hemoglobin: 12.7 g/dL (ref 12.0–15.0)
Immature Granulocytes: 0 %
Lymphocytes Relative: 42 %
Lymphs Abs: 2.2 10*3/uL (ref 0.7–4.0)
MCH: 28.5 pg (ref 26.0–34.0)
MCHC: 33.5 g/dL (ref 30.0–36.0)
MCV: 85.2 fL (ref 80.0–100.0)
Monocytes Absolute: 0.4 10*3/uL (ref 0.1–1.0)
Monocytes Relative: 8 %
Neutro Abs: 2.5 10*3/uL (ref 1.7–7.7)
Neutrophils Relative %: 47 %
Platelet Count: 214 10*3/uL (ref 150–400)
RBC: 4.45 MIL/uL (ref 3.87–5.11)
RDW: 13.5 % (ref 11.5–15.5)
WBC Count: 5.2 10*3/uL (ref 4.0–10.5)
nRBC: 0 % (ref 0.0–0.2)

## 2018-12-11 LAB — TSH: TSH: 1.93 u[IU]/mL (ref 0.308–3.960)

## 2018-12-11 LAB — LACTATE DEHYDROGENASE: LDH: 162 U/L (ref 98–192)

## 2018-12-11 MED ORDER — DIPHENHYDRAMINE HCL 50 MG PO TABS: 50.0000 mg | ORAL_TABLET | Freq: Every evening | ORAL | 0 refills | Status: DC | PRN

## 2018-12-11 MED ORDER — PREDNISONE 50 MG PO TABS: ORAL_TABLET | ORAL | 0 refills | Status: DC

## 2018-12-11 NOTE — Progress Notes (Signed)
[facility] [phone]   [phone]  CONSULT NOTE  REFERRING PHYSICIAN: Roe Coombs, PA  REASON FOR CONSULTATION:  33 years old white female with history of Hodgkin lymphoma.  HPI Dominique Rhodes is a 33 y.o. female's past medical history significant for stage II Hodgkin's lymphoma diagnosed in December 2009 status post 4 cycles of systemic chemotherapy with ABVD completed on Oct 25, 2008 followed by involved field radiotherapy under the care of Dr. Pablo Ledger completed December 22, 2008 and the patient has been on observation since that time.  She was lost to follow-up for the last 8 years.  Her medical history is also concerning for hypothyroidism in addition to endometrial polyps and unilateral salpingectomy as well as depression and anxiety.  The patient was referred back to me today for evaluation and close monitoring of her condition.  When seen today she is feeling fine but she is under a lot of stress.  She denied having any current chest pain, shortness of breath except with exertion with no cough or hemoptysis.  She is working on weight loss with keto diet.  She noticed some swelling on the right side of her neck and she is concerned about disease recurrence.  She denied having any night sweats.  She denied having any nausea, vomiting, diarrhea or constipation.  She denied having any headache or visual changes.  She has no fever or chills.  She is currently on treatment with levothyroxine 50 mcg p.o. daily. Family history significant for father who was recently diagnosed with renal cell carcinoma and currently undergoing treatment under my care.  She also has a a sister with history of Hodgkin lymphoma and she lives in [place]. The patient is now married and has no children.  She has no history for smoking, alcohol or drug abuse. HPI  Past Medical History:  Diagnosis Date  . Adnexal mass   . Anxiety   . Depression   . Endometrial polyp   .  Hodgkin lymphoma ([place])   . Hypothyroidism     Past Surgical History:  Procedure Laterality Date  . LAPAROSCOPIC UNILATERAL SALPINGECTOMY      Family History  Problem Relation Age of Onset  . Kidney cancer Father   . Hodgkin's lymphoma Sister     Social History Social History   Tobacco Use  . Smoking status: Former Research scientist (life sciences)  . Smokeless tobacco: Never Used  . Tobacco comment: occasionally for only 6 months 2006  Substance Use Topics  . Alcohol use: Not Currently  . Drug use: Never    Allergies  Allergen Reactions  . Contrast Media [Iodinated Diagnostic Agents]   . Iodine-131 Nausea And Vomiting    Current Outpatient Medications  Medication Sig Dispense Refill  . calcium carbonate (TUMS - DOSED IN MG ELEMENTAL CALCIUM) 500 MG chewable tablet Chew by mouth.    . levothyroxine (SYNTHROID) 50 MCG tablet Take 50 mcg by mouth daily.    Marland Kitchen levothyroxine (SYNTHROID) 50 MCG tablet TAKE 1 TABLET BY MOUTH ONCE DAILY     No current facility-administered medications for this visit.     Review of Systems  Constitutional: positive for fatigue and weight loss Eyes: negative Ears, nose, mouth, throat, and face: negative Respiratory: positive for dyspnea on exertion Cardiovascular: negative Gastrointestinal: negative Genitourinary:negative Integument/breast: negative Hematologic/lymphatic: negative Musculoskeletal:negative Neurological: negative Behavioral/Psych: negative Endocrine: negative Allergic/Immunologic: negative  Physical Exam  XBD:ZHGDJ, healthy, no distress, well nourished, well developed and anxious SKIN: skin color, texture, turgor are normal,  no rashes or significant lesions HEAD: Normocephalic, No masses, lesions, tenderness or abnormalities EYES: normal, PERRLA, Conjunctiva are pink and non-injected EARS: External ears normal, Canals clear OROPHARYNX:no exudate, no erythema and lips, buccal mucosa, and tongue normal  NECK: supple, no adenopathy, no JVD  LYMPH:  no palpable lymphadenopathy, no hepatosplenomegaly BREAST:not examined LUNGS: clear to auscultation , and palpation HEART: regular rate & rhythm, no murmurs and no gallops ABDOMEN:abdomen soft, non-tender, obese, normal bowel sounds and no masses or organomegaly BACK: No CVA tenderness, Range of motion is normal EXTREMITIES:no joint deformities, effusion, or inflammation, no edema  NEURO: alert & oriented x 3 with fluent speech, no focal motor/sensory deficits  PERFORMANCE STATUS: ECOG 0  LABORATORY DATA: Lab Results  Component Value Date   WBC 5.2 12/11/2018   HGB 12.7 12/11/2018   HCT 37.9 12/11/2018   MCV 85.2 12/11/2018   PLT 214 12/11/2018      Chemistry      Component Value Date/Time   NA 136 12/11/2018 1258   K 4.2 12/11/2018 1258   CL 104 12/11/2018 1258   CO2 23 12/11/2018 1258   BUN 13 12/11/2018 1258   CREATININE 0.72 12/11/2018 1258      Component Value Date/Time   CALCIUM 9.2 12/11/2018 1258   ALKPHOS 48 12/11/2018 1258   AST 41 12/11/2018 1258   ALT 59 (H) 12/11/2018 1258   BILITOT 0.5 12/11/2018 1258       RADIOGRAPHIC STUDIES: No results found.  ASSESSMENT: This is a very pleasant 33 years old white female with history of stage II Hodgkin's lymphoma diagnosed in December 2009 status post 4 cycles of systemic chemotherapy with ABVD completed Oct 25, 2008 followed by involved field radiotherapy completed December 22, 2008.  The patient has been on observation since that time. The patient is feeling fine today with no concerning complaints except for mild swelling on the right side of the neck.  I did not feel any palpable lymphadenopathy in that area.  PLAN: I had a lengthy discussion with the patient today about her current condition as well as further recommendation regarding monitoring of her condition and preventive measures. I recommended for the patient to have repeat CT scan of the neck and chest to rule out any disease recurrence. We will  continue to monitor her TSH closely with her primary care physician and adjust her dose as needed. The patient may also need a mammogram earlier than general population because of her previous radiotherapy treatment. I will see her back for follow-up visit in 1 year for evaluation and repeat blood work. She was advised to call immediately if she has any concerning symptoms in the interval. The patient voices understanding of current disease status and treatment options and is in agreement with the current care plan.  All questions were answered. The patient knows to call the clinic with any problems, questions or concerns. We can certainly see the patient much sooner if necessary.  Thank you so much for allowing me to participate in the care of Dominique Rhodes. I will continue to follow up the patient with you and assist in her care.  I spent 40 minutes counseling the patient face to face. The total time spent in the appointment was 60 minutes.  Disclaimer: This note was dictated with voice recognition software. Similar sounding words can inadvertently be transcribed and may not be corrected upon review.   Eilleen Kempf December 11, 2018, 12:13 PM

## 2018-12-12 ENCOUNTER — Encounter: Payer: Self-pay | Admitting: Internal Medicine

## 2018-12-15 ENCOUNTER — Telehealth: Payer: Self-pay | Admitting: Medical Oncology

## 2018-12-15 ENCOUNTER — Encounter: Payer: Self-pay | Admitting: Internal Medicine

## 2018-12-15 ENCOUNTER — Telehealth: Payer: Self-pay | Admitting: Internal Medicine

## 2018-12-15 NOTE — Telephone Encounter (Signed)
Scheduled appt per 7/02 los - mailed letter with appt date and time

## 2018-12-15 NOTE — Telephone Encounter (Signed)
7/2-Lab results reviewed per pt request.  [id number]  - requests results called to her same day .

## 2018-12-17 ENCOUNTER — Telehealth: Payer: Self-pay | Admitting: Medical Oncology

## 2018-12-17 NOTE — Telephone Encounter (Signed)
CT Prep instructions given to pt to take prednisone and benadryl as directed..She voiced understanding.

## 2018-12-18 ENCOUNTER — Other Ambulatory Visit: Payer: Self-pay

## 2018-12-18 ENCOUNTER — Telehealth: Payer: Self-pay | Admitting: Medical Oncology

## 2018-12-18 ENCOUNTER — Ambulatory Visit (HOSPITAL_COMMUNITY)
Admission: RE | Admit: 2018-12-18 | Discharge: 2018-12-18 | Disposition: A | Discharge status: Home / Self Care | Payer: 59 | Source: Ambulatory Visit | Attending: Internal Medicine | Admitting: Internal Medicine

## 2018-12-18 ENCOUNTER — Encounter: Payer: Self-pay | Admitting: Internal Medicine

## 2018-12-18 DIAGNOSIS — C8112 Nodular sclerosis classical Hodgkin lymphoma, intrathoracic lymph nodes: Secondary | ICD-10-CM | POA: Diagnosis not present

## 2018-12-18 MED ORDER — IOHEXOL 300 MG/ML  SOLN
75.0000 mL | Freq: Once | INTRAMUSCULAR | Status: AC | PRN
  Administered 2018-12-18: 12:00:00 75 mL via INTRAVENOUS

## 2018-12-18 MED ORDER — SODIUM CHLORIDE (PF) 0.9 % IJ SOLN
INTRAMUSCULAR | Status: AC
  Filled 2018-12-18: qty 50

## 2018-12-18 NOTE — Telephone Encounter (Signed)
Pt called again to get results today if possible.  She just had CT neck.

## 2018-12-18 NOTE — Telephone Encounter (Signed)
I called the patient and updated her with the scan results.

## 2018-12-22 ENCOUNTER — Other Ambulatory Visit: Payer: Self-pay | Admitting: *Deleted

## 2018-12-22 MED ORDER — DIPHENHYDRAMINE HCL 50 MG PO TABS: 50.0000 mg | ORAL_TABLET | Freq: Every evening | ORAL | 0 refills | Status: AC | PRN

## 2018-12-22 MED ORDER — PREDNISONE 50 MG PO TABS: ORAL_TABLET | ORAL | 0 refills | Status: AC

## 2018-12-22 NOTE — Telephone Encounter (Signed)
Pt called requesting Prednisone and Benadryl Rx be resent to Pharmacy as she has a scan this week and was unable to pick up previously.)

## 2018-12-25 ENCOUNTER — Ambulatory Visit (HOSPITAL_COMMUNITY)
Admission: RE | Admit: 2018-12-25 | Discharge: 2018-12-25 | Disposition: A | Discharge status: Home / Self Care | Payer: 59 | Source: Ambulatory Visit | Attending: Internal Medicine | Admitting: Internal Medicine

## 2018-12-25 ENCOUNTER — Encounter (HOSPITAL_COMMUNITY): Payer: Self-pay

## 2018-12-25 ENCOUNTER — Other Ambulatory Visit: Payer: Self-pay

## 2018-12-25 DIAGNOSIS — C8112 Nodular sclerosis classical Hodgkin lymphoma, intrathoracic lymph nodes: Secondary | ICD-10-CM | POA: Diagnosis present

## 2018-12-25 MED ORDER — SODIUM CHLORIDE (PF) 0.9 % IJ SOLN
INTRAMUSCULAR | Status: AC
  Filled 2018-12-25: qty 50

## 2018-12-25 MED ORDER — IOHEXOL 300 MG/ML  SOLN
75.0000 mL | Freq: Once | INTRAMUSCULAR | Status: AC | PRN
  Administered 2018-12-25: 09:00:00 75 mL via INTRAVENOUS

## 2019-09-17 ENCOUNTER — Ambulatory Visit: Payer: Self-pay

## 2019-09-17 ENCOUNTER — Ambulatory Visit: Payer: Self-pay | Attending: Internal Medicine

## 2019-09-17 DIAGNOSIS — Z23 Encounter for immunization: Secondary | ICD-10-CM

## 2019-09-17 NOTE — Progress Notes (Signed)
   Covid-19 Vaccination Clinic  Name:  Dominique Rhodes    MRN: [id number] DOB: 04-09-1986  09/17/2019  [name] was observed post Covid-19 immunization for 15 minutes without incident. She was provided with Vaccine Information Sheet and instruction to access the V-Safe system.   [name] was instructed to call 911 with any severe reactions post vaccine: Marland Kitchen Difficulty breathing  . Swelling of face and throat  . A fast heartbeat  . A bad rash all over body  . Dizziness and weakness   Immunizations Administered    Name Date Dose VIS Date Route   Pfizer COVID-19 Vaccine 09/17/2019  8:43 AM 0.3 mL 05/22/2019 Intramuscular   Manufacturer: [place]   Lot: [id number]   [place]: [id number]

## 2019-10-08 ENCOUNTER — Ambulatory Visit: Payer: Self-pay | Attending: Internal Medicine

## 2019-10-08 DIAGNOSIS — Z23 Encounter for immunization: Secondary | ICD-10-CM

## 2019-10-08 NOTE — Progress Notes (Signed)
   Covid-19 Vaccination Clinic  Name:  Dominique Rhodes    MRN: [id number] DOB: 1985/11/22  10/08/2019  [name] was observed post Covid-19 immunization for 15 minutes without incident. She was provided with Vaccine Information Sheet and instruction to access the V-Safe system.   [name] was instructed to call 911 with any severe reactions post vaccine: Marland Kitchen Difficulty breathing  . Swelling of face and throat  . A fast heartbeat  . A bad rash all over body  . Dizziness and weakness   Immunizations Administered    Name Date Dose VIS Date Route   Pfizer COVID-19 Vaccine 10/08/2019  9:25 AM 0.3 mL 08/05/2018 Intramuscular   Manufacturer: [place]   Lot: [id number]   [place]: [id number]

## 2019-12-15 ENCOUNTER — Ambulatory Visit: Payer: 59 | Admitting: Internal Medicine

## 2019-12-15 ENCOUNTER — Other Ambulatory Visit: Payer: 59

## 2020-01-14 ENCOUNTER — Ambulatory Visit: Payer: Self-pay | Admitting: Internal Medicine

## 2020-01-14 ENCOUNTER — Other Ambulatory Visit: Payer: Self-pay

## 2020-01-21 ENCOUNTER — Encounter: Payer: Self-pay | Admitting: Internal Medicine

## 2020-01-21 ENCOUNTER — Inpatient Hospital Stay: Payer: 59 | Attending: Internal Medicine

## 2020-01-21 ENCOUNTER — Telehealth: Payer: Self-pay | Admitting: Medical Oncology

## 2020-01-21 ENCOUNTER — Telehealth: Payer: Self-pay | Admitting: Internal Medicine

## 2020-01-21 ENCOUNTER — Other Ambulatory Visit: Payer: Self-pay

## 2020-01-21 ENCOUNTER — Inpatient Hospital Stay (HOSPITAL_BASED_OUTPATIENT_CLINIC_OR_DEPARTMENT_OTHER): Payer: 59 | Admitting: Internal Medicine

## 2020-01-21 VITALS — BP 125/86 | HR 109 | Temp 95.6°F | Resp 18 | Ht 68.0 in | Wt 272.5 lb

## 2020-01-21 DIAGNOSIS — Z8571 Personal history of Hodgkin lymphoma: Secondary | ICD-10-CM | POA: Diagnosis not present

## 2020-01-21 DIAGNOSIS — C8192 Hodgkin lymphoma, unspecified, intrathoracic lymph nodes: Secondary | ICD-10-CM | POA: Diagnosis not present

## 2020-01-21 DIAGNOSIS — C8112 Nodular sclerosis classical Hodgkin lymphoma, intrathoracic lymph nodes: Secondary | ICD-10-CM

## 2020-01-21 LAB — CMP (CANCER CENTER ONLY)
ALT: 71 U/L — ABNORMAL HIGH (ref 0–44)
AST: 55 U/L — ABNORMAL HIGH (ref 15–41)
Albumin: 3.9 g/dL (ref 3.5–5.0)
Alkaline Phosphatase: 54 U/L (ref 38–126)
Anion gap: 9 (ref 5–15)
BUN: 13 mg/dL (ref 6–20)
CO2: 21 mmol/L — ABNORMAL LOW (ref 22–32)
Calcium: 9.5 mg/dL (ref 8.9–10.3)
Chloride: 106 mmol/L (ref 98–111)
Creatinine: 0.65 mg/dL (ref 0.44–1.00)
GFR, Est AFR Am: >60 mL/min (ref >60)
GFR, Estimated: >60 mL/min (ref >60)
Glucose, Bld: 97 mg/dL (ref 70–99)
Potassium: 3.9 mmol/L (ref 3.5–5.1)
Sodium: 136 mmol/L (ref 135–145)
Total Bilirubin: 0.6 mg/dL (ref 0.3–1.2)
Total Protein: 7.7 g/dL (ref 6.5–8.1)

## 2020-01-21 LAB — CBC WITH DIFFERENTIAL (CANCER CENTER ONLY)
Abs Immature Granulocytes: 0.03 10*3/uL (ref 0.00–0.07)
Basophils Absolute: 0 10*3/uL (ref 0.0–0.1)
Basophils Relative: 1 %
Eosinophils Absolute: 0.2 10*3/uL (ref 0.0–0.5)
Eosinophils Relative: 3 %
HCT: 35.4 % — ABNORMAL LOW (ref 36.0–46.0)
Hemoglobin: 11.9 g/dL — ABNORMAL LOW (ref 12.0–15.0)
Immature Granulocytes: 1 %
Lymphocytes Relative: 41 %
Lymphs Abs: 2 10*3/uL (ref 0.7–4.0)
MCH: 28.9 pg (ref 26.0–34.0)
MCHC: 33.6 g/dL (ref 30.0–36.0)
MCV: 85.9 fL (ref 80.0–100.0)
Monocytes Absolute: 0.4 10*3/uL (ref 0.1–1.0)
Monocytes Relative: 7 %
Neutro Abs: 2.3 10*3/uL (ref 1.7–7.7)
Neutrophils Relative %: 47 %
Platelet Count: 204 10*3/uL (ref 150–400)
RBC: 4.12 MIL/uL (ref 3.87–5.11)
RDW: 12.8 % (ref 11.5–15.5)
WBC Count: 4.9 10*3/uL (ref 4.0–10.5)
nRBC: 0 % (ref 0.0–0.2)

## 2020-01-21 LAB — LACTATE DEHYDROGENASE: LDH: 218 U/L — ABNORMAL HIGH (ref 98–192)

## 2020-01-21 NOTE — Telephone Encounter (Signed)
Avoid Tylenol and ibuprofen if possible.  We can repeat her c-Met in 1 month to make sure her liver enzymes are better.  Thank you.

## 2020-01-21 NOTE — Telephone Encounter (Signed)
Scheduled per 8/12 los. No avs or calendar needed to be printed. Pt is aware of appt time and date.

## 2020-01-21 NOTE — Telephone Encounter (Signed)
Dominique Rhodes is concerned about elevated labs on CMP and LDH.

## 2020-01-21 NOTE — Addendum Note (Signed)
Addended by: Ardeen Garland on: 01/21/2020 09:48 AM   Modules accepted: Orders

## 2020-01-21 NOTE — Progress Notes (Signed)
Dominique [phone]   [phone]  OFFICE PROGRESS NOTE  Dominique Dials, PA-C [place] [address]  DIAGNOSIS: History of stage II Hodgkin's lymphoma diagnosed in December 2009.  PRIOR THERAPY: Status post 4 cycles of systemic chemotherapy with ABVD completed Oct 25, 2008 followed by involved field radiotherapy completed December 22, 2008.   CURRENT THERAPY: Observation.  INTERVAL HISTORY: Dominique Rhodes 34 y.o. female returns to the clinic today for follow-up visit.  The patient is feeling fine today with no concerning complaints except for feeling a nodule in the lower back.  She denied having any current chest pain, shortness of breath, cough or hemoptysis.  She denied having any fever or chills.  She has no nausea, vomiting, diarrhea or constipation.  She denied having any palpable lymphadenopathy or bleeding issues.  She is here today for evaluation and repeat blood work.  MEDICAL HISTORY: Past Medical History:  Diagnosis Date   Adnexal mass    Anxiety    Depression    Endometrial polyp    Hodgkin lymphoma ([place])    Hypothyroidism     ALLERGIES:  is allergic to contrast media [iodinated diagnostic agents] and iodine-131.  MEDICATIONS:  Current Outpatient Medications  Medication Sig Dispense Refill   calcium carbonate (TUMS - DOSED IN MG ELEMENTAL CALCIUM) 500 MG chewable tablet Chew by mouth.     diphenhydrAMINE (BENADRYL) 50 MG tablet Take 1 tablet (50 mg total) by mouth at bedtime as needed for itching. 2 hours before the CT scan. 1 tablet 0   levothyroxine (SYNTHROID) 50 MCG tablet Take 50 mcg by mouth daily.     levothyroxine (SYNTHROID) 50 MCG tablet TAKE 1 TABLET BY MOUTH ONCE DAILY     predniSONE (DELTASONE) 50 MG tablet 1 tablet p.o. 13-hour, 7-hour and 2 hours before the CT scan 3 tablet 0   No current facility-administered medications for this visit.    SURGICAL HISTORY:  Past  Surgical History:  Procedure Laterality Date   LAPAROSCOPIC UNILATERAL SALPINGECTOMY      REVIEW OF SYSTEMS:  A comprehensive review of systems was negative.   PHYSICAL EXAMINATION: General appearance: alert, cooperative and no distress Head: Normocephalic, without obvious abnormality, atraumatic Neck: no adenopathy, no JVD, supple, symmetrical, trachea midline and thyroid not enlarged, symmetric, no tenderness/mass/nodules Lymph nodes: Cervical, supraclavicular, and axillary nodes normal. Resp: clear to auscultation bilaterally Back: symmetric, no curvature. ROM normal. No CVA tenderness. Cardio: regular rate and rhythm, S1, S2 normal, no murmur, click, rub or gallop GI: soft, non-tender; bowel sounds normal; no masses,  no organomegaly Extremities: extremities normal, atraumatic, no cyanosis or edema  ECOG PERFORMANCE STATUS: 1 - Symptomatic but completely ambulatory  Blood pressure 125/86, pulse (!) 109, temperature (!) 95.6 F (35.3 C), temperature source Tympanic, resp. rate 18, height 5\' 8"  (1.727 m), weight 272 lb 8 oz (123.6 kg), SpO2 100 %.  LABORATORY DATA: Lab Results  Component Value Date   WBC 4.9 01/21/2020   HGB 11.9 (L) 01/21/2020   HCT 35.4 (L) 01/21/2020   MCV 85.9 01/21/2020   PLT 204 01/21/2020      Chemistry      Component Value Date/Time   NA 136 12/11/2018 1258   K 4.2 12/11/2018 1258   CL 104 12/11/2018 1258   CO2 23 12/11/2018 1258   BUN 13 12/11/2018 1258   CREATININE 0.72 12/11/2018 1258      Component Value Date/Time   CALCIUM 9.2  12/11/2018 1258   ALKPHOS 48 12/11/2018 1258   AST 41 12/11/2018 1258   ALT 59 (H) 12/11/2018 1258   BILITOT 0.5 12/11/2018 1258       RADIOGRAPHIC STUDIES: No results found.  ASSESSMENT AND PLAN: This is a very pleasant 35 years old white female with history of a stage II Hodgkin's lymphoma diagnosed in December 2009 status post 4 cycles of systemic chemotherapy with ABVD followed by involved field  radiation and completed on December 22, 2008.  The patient has been in observation since that time. She is here today for evaluation of repeat blood work.  She has no concerning complaints and the suspicious nodule on the back is likely fat tissue. I recommended for the patient to continue on observation with repeat CBC, comprehensive metabolic panel and LDH in 1 year. She was advised to call immediately if she has any concerning symptoms in the interval. The patient voices understanding of current disease status and treatment options and is in agreement with the current care plan.  All questions were answered. The patient knows to call the clinic with any problems, questions or concerns. We can certainly see the patient much sooner if necessary.   Disclaimer: This note was dictated with voice recognition software. Similar sounding words can inadvertently be transcribed and may not be corrected upon review.

## 2020-01-22 ENCOUNTER — Other Ambulatory Visit: Payer: Self-pay | Admitting: *Deleted

## 2020-01-22 DIAGNOSIS — C8192 Hodgkin lymphoma, unspecified, intrathoracic lymph nodes: Secondary | ICD-10-CM

## 2020-01-25 ENCOUNTER — Encounter: Payer: Self-pay | Admitting: Medical Oncology

## 2020-02-23 ENCOUNTER — Inpatient Hospital Stay: Payer: 59 | Attending: Internal Medicine

## 2021-01-19 ENCOUNTER — Inpatient Hospital Stay: Payer: Self-pay | Attending: Internal Medicine | Admitting: Internal Medicine

## 2021-01-19 ENCOUNTER — Inpatient Hospital Stay: Payer: Self-pay
